# Patient Record
Sex: Female | Born: 1980 | Race: White | Hispanic: No | Marital: Married | Smoking: Never smoker
Health system: Southern US, Community
[De-identification: ages and names within clinical notes are randomized; demographics above are authoritative.]

## PROBLEM LIST (undated history)

## (undated) ENCOUNTER — Inpatient Hospital Stay (HOSPITAL_COMMUNITY): Payer: Self-pay

## (undated) DIAGNOSIS — D649 Anemia, unspecified: Secondary | ICD-10-CM

## (undated) DIAGNOSIS — H9191 Unspecified hearing loss, right ear: Secondary | ICD-10-CM

## (undated) DIAGNOSIS — G43909 Migraine, unspecified, not intractable, without status migrainosus: Secondary | ICD-10-CM

## (undated) DIAGNOSIS — T8859XA Other complications of anesthesia, initial encounter: Secondary | ICD-10-CM

## (undated) DIAGNOSIS — G44219 Episodic tension-type headache, not intractable: Secondary | ICD-10-CM

## (undated) DIAGNOSIS — T4145XA Adverse effect of unspecified anesthetic, initial encounter: Secondary | ICD-10-CM

## (undated) DIAGNOSIS — K219 Gastro-esophageal reflux disease without esophagitis: Secondary | ICD-10-CM

## (undated) HISTORY — DX: Episodic tension-type headache, not intractable: G44.219

## (undated) HISTORY — DX: Unspecified hearing loss, right ear: H91.91

## (undated) HISTORY — DX: Migraine, unspecified, not intractable, without status migrainosus: G43.909

---
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
## 2004-11-13 ENCOUNTER — Other Ambulatory Visit: Admission: RE | Admit: 2004-11-13 | Discharge: 2004-11-13 | Payer: Self-pay | Admitting: Obstetrics and Gynecology

## 2005-12-12 ENCOUNTER — Other Ambulatory Visit: Admission: RE | Admit: 2005-12-12 | Discharge: 2005-12-12 | Payer: Self-pay | Admitting: Obstetrics and Gynecology

## 2010-02-22 ENCOUNTER — Inpatient Hospital Stay (HOSPITAL_COMMUNITY): Admission: AD | Admit: 2010-02-22 | Discharge: 2010-02-22 | Payer: Self-pay | Admitting: Obstetrics and Gynecology

## 2010-10-21 ENCOUNTER — Other Ambulatory Visit: Payer: Self-pay | Admitting: Obstetrics and Gynecology

## 2010-10-21 ENCOUNTER — Inpatient Hospital Stay (HOSPITAL_COMMUNITY): Payer: 59

## 2010-10-21 ENCOUNTER — Inpatient Hospital Stay (HOSPITAL_COMMUNITY)
Admission: AD | Admit: 2010-10-21 | Discharge: 2010-10-21 | Disposition: A | Discharge status: Home / Self Care | Payer: 59 | Source: Home / Self Care | Attending: Obstetrics and Gynecology | Admitting: Obstetrics and Gynecology

## 2010-10-21 ENCOUNTER — Encounter (HOSPITAL_COMMUNITY): Payer: Self-pay

## 2010-10-21 ENCOUNTER — Inpatient Hospital Stay (HOSPITAL_COMMUNITY)
Admission: AD | Admit: 2010-10-21 | Discharge: 2010-10-24 | DRG: 766 | Disposition: A | Discharge status: Home / Self Care | Payer: 59 | Source: Ambulatory Visit | Attending: Obstetrics and Gynecology | Admitting: Obstetrics and Gynecology

## 2010-10-21 DIAGNOSIS — O99893 Other specified diseases and conditions complicating puerperium: Secondary | ICD-10-CM | POA: Diagnosis not present

## 2010-10-21 DIAGNOSIS — D649 Anemia, unspecified: Secondary | ICD-10-CM | POA: Diagnosis not present

## 2010-10-21 DIAGNOSIS — R51 Headache: Secondary | ICD-10-CM | POA: Diagnosis not present

## 2010-10-21 DIAGNOSIS — O479 False labor, unspecified: Secondary | ICD-10-CM | POA: Insufficient documentation

## 2010-10-21 DIAGNOSIS — O9903 Anemia complicating the puerperium: Secondary | ICD-10-CM | POA: Diagnosis not present

## 2010-10-21 DIAGNOSIS — O321XX0 Maternal care for breech presentation, not applicable or unspecified: Principal | ICD-10-CM | POA: Diagnosis present

## 2010-10-21 LAB — CBC
MCH: 24.9 pg — ABNORMAL LOW (ref 26.0–34.0)
MCV: 78.6 fL (ref 78.0–100.0)
Platelets: 287 10*3/uL (ref 150–400)
RBC: 4.06 MIL/uL (ref 3.87–5.11)
RDW: 15.7 % — ABNORMAL HIGH (ref 11.5–15.5)

## 2010-10-22 LAB — CBC
HCT: 22.8 % — ABNORMAL LOW (ref 36.0–46.0)
Hemoglobin: 7.3 g/dL — ABNORMAL LOW (ref 12.0–15.0)
MCH: 25.1 pg — ABNORMAL LOW (ref 26.0–34.0)
MCHC: 32 g/dL (ref 30.0–36.0)
MCV: 78.4 fL (ref 78.0–100.0)
Platelets: 219 10*3/uL (ref 150–400)
RBC: 2.91 MIL/uL — ABNORMAL LOW (ref 3.87–5.11)
RDW: 15.8 % — ABNORMAL HIGH (ref 11.5–15.5)
WBC: 14.8 10*3/uL — ABNORMAL HIGH (ref 4.0–10.5)

## 2010-10-22 LAB — RPR: RPR Ser Ql: NONREACTIVE

## 2010-10-23 LAB — CBC
MCV: 80.1 fL (ref 78.0–100.0)
Platelets: 232 10*3/uL (ref 150–400)
RBC: 2.91 MIL/uL — ABNORMAL LOW (ref 3.87–5.11)
RDW: 16.2 % — ABNORMAL HIGH (ref 11.5–15.5)
WBC: 13.5 10*3/uL — ABNORMAL HIGH (ref 4.0–10.5)

## 2010-10-25 NOTE — Op Note (Signed)
[name], [name]             ACCOUNT NO.:  [id number]  MEDICAL RECORD NO.:  [id number]           PATIENT TYPE:  I  LOCATION:  9122                          FACILITY:  [facility]  PHYSICIAN:  Crist Fat. Gaylene Moylan, M.D. DATE OF BIRTH:  December 25, 1980  DATE OF PROCEDURE: DATE OF DISCHARGE:                              OPERATIVE REPORT   PREOPERATIVE DIAGNOSIS:  Intrauterine pregnancy at 40 weeks and 5 days, breech presentation, early labor.  POSTOPERATIVE DIAGNOSIS:  Intrauterine pregnancy at 40 weeks and 5 days, breech presentation, early labor.  ANESTHESIA:  Spinal, Dana Allan, MD  PROCEDURE:  Primary low transverse cesarean section.  SURGEON:  Crist Fat. Jagar Lua, MD  ASSISTANT:  Larna Daughters, certified nurse midwife.  ESTIMATED BLOOD LOSS:  800 mL.  PROCEDURE IN DETAIL:  After being informed of the planned procedure with possible complications including bleeding, infection, injury to other organs, informed consent was obtained.  The patient was taken to OR #1 given spinal anesthesia without any complication.  She is placed in the dorsal decubitus position, pelvis tilted to the left, prepped and draped in a sterile fashion and a Foley catheter was inserted in her bladder. After assessing adequate level of anesthesia, we infiltrated the suprapubic area using 20 mL of Marcaine 0.25 and we performed a Pfannenstiel incision which was brought down sharply to the fascia.  The fascia was incised in a low transverse fashion.  Linea alba is dissected.  Peritoneum is entered bluntly.  Alexis retractor is easily positioned and visceral peritoneum is entered in a low transverse fashion allowing [place] to safely retract bladder by developing a bladder flap.  The myometrium is entered in a low transverse fashion first with knife then extended bluntly.  Amniotic fluid is thick meconium as previously known after spontaneous rupture of membranes and early labor.  We assisted the birth of a  female infant in frank breech presentation at 8:40 p.m. with an easy delivery of the head.  Mouth and nose were suctioned. Cord was clamped with two Kelly clamps and baby is given to Dr. Eric Form, neonatologist present in the room.  To be noted a 3-cm thrombus almost at the umbilical cord insertion.  This finding was shared with the neonatologist.  10 mL of blood was drawn from the umbilical vein and the placenta was allowed to deliver spontaneously.  This complete cord has three vessels and the placenta was sent to pathology.  Uterine revision was negative.  Myometrium was closed in two layers; first with a running lock suture of 0 Vicryl, then with a Lembert suture of 0 Vicryl imbricating the first one.  Hemostasis is completed on peritoneal edges using cauterization.  Both paracolic gutters were cleaned.  Both tubes and ovaries assessed and normal.  The pelvis was profusely irrigated with warm saline to confirm a satisfactory hemostasis.  Sponges and retractors were removed and under fascia hemostasis was completed with cauterization.  The fascia was closed with two running sutures of 1 Vicryl meeting midline.  The wound is irrigated with warm saline.  Hemostasis was completed with cauterization and the skin was closed with a subcuticular suture of 3-0  Monocryl and Steri-Strips.  Instrument and sponge count is complete x2.  Estimated blood loss was 800 mL.  The procedure was well tolerated by patient who is taken to recovery room in a well and stable condition.  Little boy named, Blenda Bridegroom was born at 8:40 p.m., received an Apgar of 8 at 1 minute and 9 at 5 minutes and weight 9 pounds 4 ounces.  SPECIMEN:  Placenta sent to Pathology.     Crist Fat Hayato Guaman, M.D.     SAR/MEDQ  D:  10/21/2010  T:  10/22/2010  Job:  [id number]  Electronically Signed by Silverio Lay M.D. on 10/25/2010 04:54:09 PM

## 2010-11-16 NOTE — Discharge Summary (Signed)
[name], [name]             ACCOUNT NO.:  [id number]  MEDICAL RECORD NO.:  [id number]           PATIENT TYPE:  I  LOCATION:  9122                          [facility]  [facility]  PHYSICIAN:  Hal Morales, M.D.DATE OF BIRTH:  12-27-1980  DATE OF ADMISSION:  10/21/2010 DATE OF DISCHARGE:  10/24/2010                              DISCHARGE SUMMARY   ADMITTING DIAGNOSES: 1. Intrauterine pregnancy at 40-5/7th weeks. 2. Breech presentation. 3. Early labor.  DISCHARGE DIAGNOSES: 1. Intrauterine pregnancy at 40-5/7th weeks. 2. Breech presentation. 3. Early labor. 4. Anemia. 5. Placenta with umbilical insertion thrombus.  PROCEDURES: 1. Primary low-transverse cesarean section. 2. Spinal anesthesia.  HOSPITAL COURSE:  Ms. [name] is a 30 year old gravida 1, para 0, 40- 5/7th weeks who presented on October 21, 2010 with spontaneous rupture of membranes and meconium stained fluid.  Breech presentation was identified and the patient was consented for C-section.  Her pregnancy had been remarkable for: 1. Third trimester anemia. 2. First trimester vaginal bleeding. 3. Group B strep negative.  The patient was taken to the operating room by Dr. Dois Davenport Rivard where a primary low-transverse cesarean section was performed for the breech presentation.  Findings were a viable female by the name of [name], weight 9 pounds 4 ounces.  Apgars were 8 and 9.  The infant was taken to the full-term nursery.  Mother was taken to recovery in good condition.  By postop day #1, the patient was doing well.  She was having some slight dizziness but no syncope.  Foley had just been removed.  She did began to void spontaneously and breast feeding was going well.  Her hemoglobin on day 1 was 7.3 down from 10.1, white blood cell count was 14.8, and platelet count was 219.  Her incision dressing was clean, dry, and intact.  She did have some upper abdominal distention but had bowel sounds.  Orthostatics  were done and were stable.  She was placed on iron.  She did decline transfusion.  On postop day #2, a CBC was repeated with hemoglobin stable at 7.4.  Physical exam remained within normal limits.  She was planning Micronor.  Breast feeding was going well.  Her incision dressing was removed.  It was clean, dry, and intact with Steri-Strips.  Rest of hospital course was uncomplicated.  By postop day #3, she was doing well.  She was up ad lib.  Breast feeding was going well.  She was having no syncope or dizziness.  She was deemed to receive full benefit of her hospital stay and was discharged home in stable condition.  Discharge instructions per [facility] handout.  DISCHARGE MEDICATIONS: 1. Motrin 600 mg p.o. q.6 h. p.r.n. pain. 2. Percocet 5/325 one to two p.o. q.2-4 h. p.r.n. pain. 3. Micronor 1 p.o. daily to be started 3 weeks after delivery. 4. She will also continue her Floradix iron supplement b.i.d.  FOLLOWUP:  Discharge followup will occur in 6 weeks at [facility].     Renaldo Reel Emilee Hero, C.N.M.   ______________________________ Hal Morales, M.D.    VLL/MEDQ  D:  10/24/2010  T:  10/24/2010  Job:  [id number]  Electronically Signed by Nigel Bridgeman C.N.M. on 10/29/2010 05:39:33 PM Electronically Signed by Dierdre Forth M.D. on 11/16/2010 07:10:09 PM

## 2010-11-17 LAB — URINALYSIS, ROUTINE W REFLEX MICROSCOPIC
Bilirubin Urine: NEGATIVE
Ketones, ur: >80 mg/dL — AB
Nitrite: NEGATIVE
Protein, ur: NEGATIVE mg/dL
Specific Gravity, Urine: >1.03 — ABNORMAL HIGH (ref 1.005–1.030)
Urobilinogen, UA: 0.2 mg/dL (ref 0.0–1.0)

## 2010-11-17 LAB — TYPE AND SCREEN
ABO/RH(D): A POS
Antibody Screen: NEGATIVE

## 2010-11-17 LAB — ABO/RH: ABO/RH(D): A POS

## 2010-11-17 LAB — URINE MICROSCOPIC-ADD ON

## 2011-11-24 ENCOUNTER — Other Ambulatory Visit: Payer: Self-pay | Admitting: Family Medicine

## 2011-11-24 DIAGNOSIS — R1011 Right upper quadrant pain: Secondary | ICD-10-CM

## 2011-11-26 ENCOUNTER — Ambulatory Visit
Admission: RE | Admit: 2011-11-26 | Discharge: 2011-11-26 | Disposition: A | Discharge status: Home / Self Care | Payer: BC Managed Care – PPO | Source: Ambulatory Visit | Attending: Family Medicine | Admitting: Family Medicine

## 2011-11-26 DIAGNOSIS — R1011 Right upper quadrant pain: Secondary | ICD-10-CM

## 2012-07-20 DIAGNOSIS — O26851 Spotting complicating pregnancy, first trimester: Secondary | ICD-10-CM | POA: Diagnosis not present

## 2012-07-21 ENCOUNTER — Telehealth: Payer: Self-pay | Admitting: Obstetrics and Gynecology

## 2012-07-22 NOTE — Telephone Encounter (Signed)
TC from pt. States LMP 06/16/12.  Had 2 dime-sized spots of blood 07/20/12.  Had +UPT 07/21/12.  Now having brownish discharge and rt-sided abd cramping.  No recent IC. Cannot come to office at this time.   Per [facility], advised increased water. May take Tylenol. To call after hours (instructions given) to be seen at MAU this PM. Will be able to let pt know at that time, waiting time in MAU.  Pt is agreeable. Trenton Gammon comprehension.

## 2012-07-23 ENCOUNTER — Encounter (HOSPITAL_COMMUNITY): Payer: Self-pay | Admitting: *Deleted

## 2012-07-23 ENCOUNTER — Other Ambulatory Visit: Payer: Self-pay

## 2012-07-23 ENCOUNTER — Inpatient Hospital Stay (HOSPITAL_COMMUNITY)
Admission: AD | Admit: 2012-07-23 | Discharge: 2012-07-23 | Disposition: A | Discharge status: Home / Self Care | Payer: 59 | Source: Ambulatory Visit | Attending: Obstetrics and Gynecology | Admitting: Obstetrics and Gynecology

## 2012-07-23 ENCOUNTER — Telehealth: Payer: Self-pay | Admitting: Obstetrics and Gynecology

## 2012-07-23 DIAGNOSIS — O209 Hemorrhage in early pregnancy, unspecified: Secondary | ICD-10-CM

## 2012-07-23 DIAGNOSIS — O26851 Spotting complicating pregnancy, first trimester: Secondary | ICD-10-CM | POA: Diagnosis not present

## 2012-07-23 DIAGNOSIS — O200 Threatened abortion: Secondary | ICD-10-CM

## 2012-07-23 DIAGNOSIS — O26859 Spotting complicating pregnancy, unspecified trimester: Secondary | ICD-10-CM | POA: Insufficient documentation

## 2012-07-23 DIAGNOSIS — R109 Unspecified abdominal pain: Secondary | ICD-10-CM | POA: Insufficient documentation

## 2012-07-23 HISTORY — DX: Migraine, unspecified, not intractable, without status migrainosus: G43.909

## 2012-07-23 HISTORY — DX: Anemia, unspecified: D64.9

## 2012-07-23 LAB — WET PREP, GENITAL
Trich, Wet Prep: NONE SEEN
Yeast Wet Prep HPF POC: NONE SEEN

## 2012-07-23 LAB — URINALYSIS, ROUTINE W REFLEX MICROSCOPIC
Glucose, UA: NEGATIVE mg/dL
Leukocytes, UA: NEGATIVE
Protein, ur: NEGATIVE mg/dL
Specific Gravity, Urine: 1.01 (ref 1.005–1.030)

## 2012-07-23 LAB — URINE MICROSCOPIC-ADD ON

## 2012-07-23 NOTE — MAU Note (Signed)
Patient states she has had two positive home pregnancy tests. Had spotting on 11-19, two small clots. Has some mild right side discomfort.

## 2012-07-23 NOTE — MAU Provider Note (Signed)
History   [name] is a 31y.o. MWF who presents at [redacted]w[redacted]d per LMP for eval of recent spotting.  Reports spotting 2d ago and describing spots about the size of a dime, and none since.  Has had some mild lower abdominal cramping.  No n/v/d.  No resp c/o's.  No UTI s/s.  No recent fever or illness.  Planned pregnancy.  H/o previous c/s for breech.  Struggled w/ first trimester n/v last pregnancy, but none this pregnancy thus far.    CSN: [id number]  Arrival date and time: 07/23/12 1455   None     Chief Complaint  Patient presents with  . Abdominal Pain   HPI  OB History    Grav Para Term Preterm Abortions TAB SAB Ect Mult Living   2 1 1       1       Past Medical History  Diagnosis Date  . Anemia   . Migraines     Past Surgical History  Procedure Date  . Cesarean section     Family History  Problem Relation Age of Onset  . Hypertension Mother   . Diabetes Father   . Cancer Father   . Hypertension Father     History  Substance Use Topics  . Smoking status: Never Smoker   . Smokeless tobacco: Not on file  . Alcohol Use: No    Allergies:  Allergies  Allergen Reactions  . Clams (Shellfish Allergy) Hives    Prescriptions prior to admission  Medication Sig Dispense Refill  . cetirizine (ZYRTEC) 10 MG tablet Take 10 mg by mouth daily.      . [DISCONTINUED] ibuprofen (ADVIL,MOTRIN) 200 MG tablet Take 200 mg by mouth every 6 (six) hours as needed. For pain        ROS--see HPI above Physical Exam   Blood pressure 124/70, pulse 100, temperature 99.1 F (37.3 C), temperature source Oral, resp. rate 16, height 5\' 6"  (1.676 m), weight 150 lb 3.2 oz (68.13 kg), last menstrual period 06/16/2012, SpO2 100.00%, unknown if currently breastfeeding. .. .. Results for orders placed during the hospital encounter of 07/23/12 (from the past 24 hour(s))  URINALYSIS, ROUTINE W REFLEX MICROSCOPIC     Status: Abnormal   Collection Time   07/23/12  3:30 PM      Component  Value Range   Color, Urine YELLOW  YELLOW   APPearance CLEAR  CLEAR   Specific Gravity, Urine 1.010  1.005 - 1.030   pH 6.0  5.0 - 8.0   Glucose, UA NEGATIVE  NEGATIVE mg/dL   Hgb urine dipstick SMALL (*) NEGATIVE   Bilirubin Urine NEGATIVE  NEGATIVE   Ketones, ur NEGATIVE  NEGATIVE mg/dL   Protein, ur NEGATIVE  NEGATIVE mg/dL   Urobilinogen, UA 0.2  0.0 - 1.0 mg/dL   Nitrite NEGATIVE  NEGATIVE   Leukocytes, UA NEGATIVE  NEGATIVE  URINE MICROSCOPIC-ADD ON     Status: Abnormal   Collection Time   07/23/12  3:30 PM      Component Value Range   Squamous Epithelial / LPF FEW (*) RARE   WBC, UA 0-2  <3 WBC/hpf   RBC / HPF 3-6  <3 RBC/hpf  HCG, QUANTITATIVE, PREGNANCY     Status: Abnormal   Collection Time   07/23/12  3:33 PM      Component Value Range   hCG, Beta Chain, Quant, S 895 (*) <5 mIU/mL  POCT PREGNANCY, URINE     Status: Abnormal   Collection  Time   07/23/12  3:34 PM      Component Value Range   Preg Test, Ur POSITIVE (*) NEGATIVE  WET PREP, GENITAL     Status: Abnormal   Collection Time   07/23/12  5:12 PM      Component Value Range   Yeast Wet Prep HPF POC NONE SEEN  NONE SEEN   Trich, Wet Prep NONE SEEN  NONE SEEN   Clue Cells Wet Prep HPF POC NONE SEEN  NONE SEEN   WBC, Wet Prep HPF POC FEW (*) NONE SEEN   Physical Exam  Constitutional: She is oriented to person, place, and time. She appears well-developed and well-nourished. No distress.       Smiling, pleasant  HENT:  Head: Normocephalic and atraumatic.  Eyes: Pupils are equal, round, and reactive to light.  Cardiovascular: Normal rate.   Respiratory: Effort normal.  GI: Soft. She exhibits no distension and no mass. There is no tenderness. There is no rebound and no guarding.  Genitourinary:       SSE:  No blood in vault; small amt white nonodorous d/c in vault, no lesions.  cx closed.  Deferred bimanual exam.  Neurological: She is alert and oriented to person, place, and time.  Skin: Skin is warm and  dry.  Psychiatric: She has a normal mood and affect. Her behavior is normal. Judgment and thought content normal.    MAU Course  Procedures 1. Wet prep 2. Gc/ct 3. Quant hcg  Assessment and Plan  1.  [redacted]w[redacted]d 2.  Spotting 2d ago 3. Rh positive 4.  Mild intermittent lower abdominal cramping  1.  D/c'd home to schedule f/u quant next week at office to follow values and will proceed w/ viability u/s prn 2.  Bleeding and SAB precautions rev'd 3.  Pt to call if onset of morning sickness and needs Rx; continue PNV; Tylenol prn pain and f/u worsening s/s or concerns  Hadden Steig H 07/23/2012, 5:39 PM

## 2012-07-23 NOTE — Telephone Encounter (Signed)
Returned pt's VM. States was unable to be seen at MAU last PM. No appts available in office today.  Per [facility], pt to call  Her directly after pt completes meeting at 2:00. Pt verbalizes comprehension. Pt states no change in sx.

## 2012-07-24 DIAGNOSIS — Z98891 History of uterine scar from previous surgery: Secondary | ICD-10-CM | POA: Insufficient documentation

## 2012-07-26 ENCOUNTER — Telehealth: Payer: Self-pay | Admitting: Obstetrics and Gynecology

## 2012-07-26 ENCOUNTER — Other Ambulatory Visit: Payer: 59

## 2012-07-26 DIAGNOSIS — O039 Complete or unspecified spontaneous abortion without complication: Secondary | ICD-10-CM

## 2012-07-26 NOTE — Telephone Encounter (Signed)
VM from [facility].  Pt os coming this AM for [facility].  May be routine, not STAT. Qhcg 07/23/12 895.

## 2012-07-27 ENCOUNTER — Other Ambulatory Visit: Payer: Self-pay | Admitting: Obstetrics and Gynecology

## 2012-07-27 ENCOUNTER — Telehealth: Payer: Self-pay

## 2012-07-27 DIAGNOSIS — O26849 Uterine size-date discrepancy, unspecified trimester: Secondary | ICD-10-CM

## 2012-07-27 LAB — GC/CHLAMYDIA PROBE AMP, GENITAL: Chlamydia, DNA Probe: NEGATIVE

## 2012-07-27 LAB — HCG, QUANTITATIVE, PREGNANCY: hCG, Beta Chain, Quant, S: 2958.5 m[IU]/mL

## 2012-07-27 NOTE — Telephone Encounter (Signed)
Tc to pt.  Per VL, informed QHCG rose appropriately.   Scheduled for U/S and F/u 07/28/12

## 2012-07-28 ENCOUNTER — Ambulatory Visit (INDEPENDENT_AMBULATORY_CARE_PROVIDER_SITE_OTHER): Payer: 59

## 2012-07-28 ENCOUNTER — Ambulatory Visit (INDEPENDENT_AMBULATORY_CARE_PROVIDER_SITE_OTHER): Payer: 59 | Admitting: Obstetrics and Gynecology

## 2012-07-28 DIAGNOSIS — O26849 Uterine size-date discrepancy, unspecified trimester: Secondary | ICD-10-CM

## 2012-07-28 DIAGNOSIS — R110 Nausea: Secondary | ICD-10-CM

## 2012-07-28 LAB — US OB TRANSVAGINAL

## 2012-07-28 MED ORDER — ONDANSETRON HCL 4 MG PO TABS: 4.0000 mg | ORAL_TABLET | Freq: Three times a day (TID) | ORAL | Status: DC | PRN

## 2012-07-28 MED ORDER — PANTOPRAZOLE SODIUM 40 MG PO TBEC: 40.0000 mg | DELAYED_RELEASE_TABLET | Freq: Every day | ORAL | Status: DC

## 2012-07-28 NOTE — Progress Notes (Signed)
Here for [place] f/u--had QHCGs done showing rise from 895 to 2958 over 3 day period. Had been seen in MAU on 11/25 by HS for spotting. No further spotting, no pain. A+ type. Had baby with [facility] 21 months ago--denies any changes in medical history or family history.  Same partner.  [place] today:  5 4/7 weeks, EDC 03/26/13, single intrauterine gestational sac, + YS. Right CL cyst, small amount fluid in CDS.  Plan: Schedule NOB w/u with VL or HS in 3 weeks, with [place] that day for verification of viability. Patient agreeable with plan. Will ask triage to transfer information from previous chart to EPIC. Requests Rxs for Protonix and Zofran--sent to pharmacy.

## 2012-08-03 DIAGNOSIS — O26851 Spotting complicating pregnancy, first trimester: Secondary | ICD-10-CM

## 2012-08-13 ENCOUNTER — Telehealth: Payer: Self-pay | Admitting: Obstetrics and Gynecology

## 2012-08-13 DIAGNOSIS — R1110 Vomiting, unspecified: Secondary | ICD-10-CM

## 2012-08-13 MED ORDER — PROMETHAZINE HCL 25 MG PO TABS: 25.0000 mg | ORAL_TABLET | Freq: Four times a day (QID) | ORAL | Status: DC | PRN

## 2012-08-13 NOTE — Telephone Encounter (Signed)
TC from patient--8 weeks, with N/V.  On Zofran, but not helping tonight. Requests Phenergan--wants to try that before determining if needs to come to hospital. Will send Rx for Phenergan 25 mg po Q 6 hours prn to [facility] [address] Patient to call if no relief of sx.

## 2012-08-18 ENCOUNTER — Other Ambulatory Visit: Payer: 59

## 2012-08-19 ENCOUNTER — Encounter: Payer: Self-pay | Admitting: Obstetrics and Gynecology

## 2012-08-19 ENCOUNTER — Ambulatory Visit (INDEPENDENT_AMBULATORY_CARE_PROVIDER_SITE_OTHER): Payer: 59 | Admitting: Obstetrics and Gynecology

## 2012-08-19 ENCOUNTER — Ambulatory Visit (INDEPENDENT_AMBULATORY_CARE_PROVIDER_SITE_OTHER): Payer: 59

## 2012-08-19 ENCOUNTER — Other Ambulatory Visit: Payer: Self-pay | Admitting: Obstetrics and Gynecology

## 2012-08-19 ENCOUNTER — Other Ambulatory Visit: Payer: Self-pay

## 2012-08-19 VITALS — BP 100/62 | Wt 148.0 lb

## 2012-08-19 DIAGNOSIS — Z9889 Other specified postprocedural states: Secondary | ICD-10-CM

## 2012-08-19 DIAGNOSIS — O26849 Uterine size-date discrepancy, unspecified trimester: Secondary | ICD-10-CM

## 2012-08-19 DIAGNOSIS — Z3490 Encounter for supervision of normal pregnancy, unspecified, unspecified trimester: Secondary | ICD-10-CM

## 2012-08-19 DIAGNOSIS — Z331 Pregnant state, incidental: Secondary | ICD-10-CM

## 2012-08-19 DIAGNOSIS — Z98891 History of uterine scar from previous surgery: Secondary | ICD-10-CM

## 2012-08-19 DIAGNOSIS — IMO0002 Reserved for concepts with insufficient information to code with codable children: Secondary | ICD-10-CM

## 2012-08-19 LAB — US OB TRANSVAGINAL

## 2012-08-19 NOTE — Progress Notes (Signed)
   [name] is being seen today for her first obstetrical visit at [redacted]w[redacted]d gestation by early Korea.  Seen at 5-6 weeks for spotting, with [place] and cultures done previously. No further spotting.  She reports doing well.  Her obstetrical history is significant for: Patient Active Problem List  Diagnosis  . Spotting in first trimester  . Previous cesarean section    Relationship with FOB: Married to [place], involved and supportive She is employed full0time with [employer].   Feeding plan: Breast    Pregnancy history fully reviewed.  The following portions of the patient's history were reviewed and updated as appropriate: allergies, current medications, past family history, past medical history, past social history, past surgical history and problem list.  Review of Systems Pertinent ROS is described in HPI   Objective:   BP 100/62  Wt 148 lb (67.132 kg)  LMP 06/16/2012  Breastfeeding? Unknown Wt Readings from Last 1 Encounters:  08/19/12 148 lb (67.132 kg)   BMI: There is no height on file to calculate BMI.  General: alert, cooperative and no distress HEENT: grossly normal  Thyroid: normal  Respiratory: clear to auscultation bilaterally Cardiovascular: regular rate and rhythm,  Breasts:  No dominant masses, nipples erect Gastrointestinal: soft, non-tender; no masses,  no organomegaly Extremities: extremities normal, no pain or edema Vaginal Bleeding: None  EXTERNAL GENITALIA: normal appearing vulva with no masses, tenderness or lesions VAGINA: no abnormal discharge or lesions CERVIX: no lesions or cervical motion tenderness; cervix closed, long, firm UTERUS: gravid and consistent with 9 weeks ADNEXA: no masses palpable and nontender OB EXAM PELVIMETRY: unproven--had previous C/S due to FTP, LGA infant  [place] today for viability:  8w 3d, EDC 03/28/13--best dating since embryo seen on this [place].  FHR:  160  bpm  Assessment:    Pregnancy at [redacted]w[redacted]d 1st trimester  spotting Previous C/S, hx LGA infant Plan:     Prenatal panel reviewed and discussed with the patient:  Done today  Pap smear collected:  Done 1/13 GC/Chlamydia collected:  Done 07/23/12 Wet prep:  Negative 07/23/12 Discussion of Genetic testing options: Declines Prenatal vitamins recommended  Plan of care: Next visit:  4 weeks for ROB Other anticipated f/u: Early glucola due to hx LGA infant Discussed VBAC issue--patient undecided regarding VBAC vs. Repeat C/S      Nigel Bridgeman, CNM

## 2012-08-19 NOTE — Progress Notes (Signed)
[redacted]w[redacted]d  Last Pap: 09/2011 Pt declined Genetic Testing. Pt states she has no concerns today. She sometimes has discomfort in her groin area.  Pt asked about blood work. I do not see prenatal labs .

## 2012-08-19 NOTE — Progress Notes (Signed)
[redacted]w[redacted]d

## 2012-08-20 LAB — PRENATAL PANEL VII
Antibody Screen: NEGATIVE
Basophils Absolute: 0.1 K/uL (ref 0.0–0.1)
Basophils Relative: 1 % (ref 0–1)
Eosinophils Absolute: 0.1 K/uL (ref 0.0–0.7)
Eosinophils Relative: 1 % (ref 0–5)
HCT: 36 % (ref 36.0–46.0)
HIV: NONREACTIVE
Hemoglobin: 12.5 g/dL (ref 12.0–15.0)
Hepatitis B Surface Ag: NEGATIVE
Lymphocytes Relative: 32 % (ref 12–46)
Lymphs Abs: 3.5 K/uL (ref 0.7–4.0)
MCH: 29.1 pg (ref 26.0–34.0)
MCHC: 34.7 g/dL (ref 30.0–36.0)
MCV: 83.9 fL (ref 78.0–100.0)
Monocytes Absolute: 0.7 K/uL (ref 0.1–1.0)
Monocytes Relative: 6 % (ref 3–12)
Neutro Abs: 6.5 K/uL (ref 1.7–7.7)
Neutrophils Relative %: 60 % (ref 43–77)
Platelets: 335 K/uL (ref 150–400)
RBC: 4.29 MIL/uL (ref 3.87–5.11)
RDW: 14.5 % (ref 11.5–15.5)
Rh Type: POSITIVE
Rubella: 3.03 {index} — ABNORMAL HIGH
WBC: 10.9 K/uL — ABNORMAL HIGH (ref 4.0–10.5)

## 2012-08-20 LAB — PATHOLOGIST SMEAR REVIEW

## 2012-08-21 ENCOUNTER — Encounter: Payer: Self-pay | Admitting: Obstetrics and Gynecology

## 2012-08-21 DIAGNOSIS — IMO0002 Reserved for concepts with insufficient information to code with codable children: Secondary | ICD-10-CM | POA: Insufficient documentation

## 2012-08-21 NOTE — Progress Notes (Signed)
May want C/S if another LGA fetus--plan Korea at 28-30 weeks and at 37-38 weeks.

## 2012-08-31 ENCOUNTER — Telehealth: Payer: Self-pay | Admitting: Obstetrics and Gynecology

## 2012-08-31 NOTE — Telephone Encounter (Signed)
Pt just wanted [place] to know that she changed her mind and now want genetic testing. Per mel will put pt request on her next visit.

## 2012-09-17 ENCOUNTER — Ambulatory Visit: Payer: 59 | Admitting: Obstetrics and Gynecology

## 2012-09-17 ENCOUNTER — Encounter: Payer: Self-pay | Admitting: Obstetrics and Gynecology

## 2012-09-17 VITALS — BP 98/58 | Wt 150.0 lb

## 2012-09-17 DIAGNOSIS — Z36 Encounter for antenatal screening of mother: Secondary | ICD-10-CM

## 2012-09-17 NOTE — Progress Notes (Signed)
[redacted]w[redacted]d Pt changed her mind and now wants 1st tri screening. Scheduled 09/20/12 @ 11 am No complaints per pt

## 2012-09-17 NOTE — Progress Notes (Signed)
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

## 2012-09-20 ENCOUNTER — Ambulatory Visit: Payer: 59

## 2012-09-20 ENCOUNTER — Other Ambulatory Visit: Payer: 59

## 2012-09-20 DIAGNOSIS — Z36 Encounter for antenatal screening of mother: Secondary | ICD-10-CM

## 2012-09-20 LAB — US OB COMP LESS 14 WKS

## 2012-10-14 ENCOUNTER — Encounter: Payer: 59 | Admitting: Obstetrics and Gynecology

## 2012-10-22 ENCOUNTER — Ambulatory Visit: Payer: 59 | Admitting: Family Medicine

## 2012-10-22 VITALS — BP 90/60 | Wt 153.0 lb

## 2012-10-22 DIAGNOSIS — Z331 Pregnant state, incidental: Secondary | ICD-10-CM

## 2012-10-22 NOTE — Progress Notes (Signed)
[redacted]w[redacted]d Doing well. HA subsided with magnesium. 1st trimester screen result given with copy. Q&A answered.  ROB 3 weeks:Discussed early Glucola (for Hx LGA) and anatomy U/S at next visit. Declined AFP.   L.Blair Mesina, FNP-BC

## 2012-10-22 NOTE — Progress Notes (Signed)
[redacted]w[redacted]d No complaints today.

## 2012-11-03 ENCOUNTER — Other Ambulatory Visit: Payer: Self-pay

## 2012-11-03 DIAGNOSIS — Z3689 Encounter for other specified antenatal screening: Secondary | ICD-10-CM

## 2012-11-08 ENCOUNTER — Ambulatory Visit: Payer: 59 | Admitting: Family Medicine

## 2012-11-08 ENCOUNTER — Other Ambulatory Visit: Payer: 59

## 2012-11-08 ENCOUNTER — Ambulatory Visit: Payer: 59

## 2012-11-08 VITALS — BP 90/60 | Wt 156.0 lb

## 2012-11-08 DIAGNOSIS — Z3689 Encounter for other specified antenatal screening: Secondary | ICD-10-CM

## 2012-11-08 DIAGNOSIS — Z331 Pregnant state, incidental: Secondary | ICD-10-CM

## 2012-11-08 LAB — US OB COMP + 14 WK

## 2012-11-08 LAB — CBC
HCT: 30.6 % — ABNORMAL LOW (ref 36.0–46.0)
Hemoglobin: 10.7 g/dL — ABNORMAL LOW (ref 12.0–15.0)
MCH: 29.8 pg (ref 26.0–34.0)
MCHC: 35 g/dL (ref 30.0–36.0)
MCV: 85.2 fL (ref 78.0–100.0)
Platelets: 303 10*3/uL (ref 150–400)
RBC: 3.59 MIL/uL — ABNORMAL LOW (ref 3.87–5.11)
RDW: 13.8 % (ref 11.5–15.5)
WBC: 9.2 10*3/uL (ref 4.0–10.5)

## 2012-11-08 NOTE — Progress Notes (Signed)
[redacted]w[redacted]d No complaints today. Ultrasound shows:  SIUP  S=D     [place] EDD: 03/28/2013           AFI: Normal fluid, AP pockt=4.3cm           Cervical length: 3.43 cm/Closed                                            Placenta localization: posterior           Fetal presentation: Vertex--Single fetus                   Anatomy survey is normal           Gender : female                                 Note:Normal placental cord insertion                                 No previa, placenta edge to cx=5.7cm                                 Measurements are concordant with established GA/EDD                                 Open hands, 5th digit seen.                                 Normal ovaries/adnexa

## 2012-11-08 NOTE — Progress Notes (Signed)
[redacted]w[redacted]d Doing well. Now having fetal movement. U/S discussed.  No questions. Early glucola today. ROB in 4 weeks.

## 2012-11-09 ENCOUNTER — Telehealth: Payer: Self-pay

## 2012-11-09 LAB — RPR

## 2012-11-09 LAB — GLUCOSE TOLERANCE, 1 HOUR (50G) W/O FASTING: Glucose, 1 Hour GTT: 91 mg/dL (ref 70–140)

## 2012-11-09 NOTE — Telephone Encounter (Signed)
Tried to reach pt regarding iron suppl. Voicemail was not working properly. Will try to reach pt again on tomorrow. [name]

## 2012-11-11 ENCOUNTER — Telehealth: Payer: Self-pay

## 2012-11-11 NOTE — Telephone Encounter (Signed)
Pt's phone not working. Letter will be sent to pt. [name]

## 2012-11-16 ENCOUNTER — Telehealth: Payer: Self-pay | Admitting: Certified Nurse Midwife

## 2012-11-16 NOTE — Telephone Encounter (Signed)
TC from pt regarding significant BH for the last 3-4 days.  Pt reports they are irregular and has not tried to time them.  Advised the pt to empty bladder, hydrate, rest, and Motrin.  Advised pt to call back if sx persist or gets worse.  Pt verbalizes understanding.

## 2012-11-24 ENCOUNTER — Other Ambulatory Visit: Payer: Self-pay | Admitting: Family Medicine

## 2012-11-24 LAB — FETAL FIBRONECTIN: Fetal Fibronectin: NEGATIVE

## 2012-11-25 LAB — GC/CHLAMYDIA PROBE AMP
CT Probe RNA: NEGATIVE
GC Probe RNA: NEGATIVE

## 2012-11-27 LAB — CULTURE, OB URINE: Colony Count: 15000

## 2013-03-15 ENCOUNTER — Other Ambulatory Visit: Payer: Self-pay | Admitting: Obstetrics and Gynecology

## 2013-03-16 ENCOUNTER — Encounter (HOSPITAL_COMMUNITY): Payer: Self-pay | Admitting: Pharmacist

## 2013-03-21 DIAGNOSIS — G43909 Migraine, unspecified, not intractable, without status migrainosus: Secondary | ICD-10-CM | POA: Diagnosis not present

## 2013-03-21 DIAGNOSIS — H9191 Unspecified hearing loss, right ear: Secondary | ICD-10-CM

## 2013-03-21 NOTE — H&P (Signed)
[name] is a 32 y.o. female, G2P1001 at 106 1/7/weeks, presenting on 03/23/13 for scheduled repeat cesarean delivery--hx of previous C/S due to breech, with LGA infant.  Patient denies leaking or bleeding, reports + FM.  This fetus has been vtx on last [place] at 36 weeks, with EFW > 90%ile.  History of present pregnancy: Patient entered care at 9 1/7 weeks.  Had early [place] at 5-6 weeks and cultures done due to 1st trimester bleeding. EDC of 03/28/13 was established by [place] at 8 3/7 weeks.   Anatomy scan:   20 weeks, with normal findings and an posterior placenta.   Additional [place] evaluations:   28 weeks, due to hx of LGA infant--EFW 65.9ile%, AFI 55ile%, normal cervical length. 36 weeks--EFW 7+13, > 90%ile, AFI 60%ile, vtx.   Significant prenatal events:  1st trimester bleeding.  FFN negative on 11/24/12.  Initially undecided regarding VBAC vs repeat C/S--eventually decided on repeat C/S if no labor by date the C/S was scheduled.  Had HAs during pregnancy, treated to magnesium.  Hgb 9.6 at 28 weeks--Fe supplementation advised.  Treated for hemorrhoids with Proctofoam HC.  Received TDaP during pregnancy. Last evaluation:  7/14, cervix 1 cm, 80%, vtx, -1.  History OB History   Grav Para Term Preterm Abortions TAB SAB Ect Mult Living   2 1 1       1     10/2010--Primary LTCS, 40 weeks, breech presentation, 9+4, spinal anesthesia, by Dr. Estanislado Pandy.  Past Medical History  Diagnosis Date  . Anemia   . Migraines   . Asthma   . Frequent episodic tension-type headache   . Migraine   . 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 Cesarean section    . 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  Social History:  reports that she has never smoked. She has never used smokeless tobacco. She reports that she does not drink alcohol or use illicit drugs. Husband, Molli Hazard, is involved and supportive.  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   Prenatal Transfer Tool  Maternal Diabetes: No Genetic Screening: Normal 1st trimester screen. Maternal Ultrasounds/Referrals: Normal Fetal Ultrasounds or other Referrals:  None Maternal Substance Abuse:  No Significant Maternal Medications:  None Significant Maternal Lab Results:  Lab values include: Group B Strep negative Other Comments:  None  ROS:  +FM.    Last menstrual period 06/16/2012. Exam Physical Exam  Chest clear Heart RRR without murmur Abd gravid, NT Pelvic--deferred Ext WNL  FHR 150s by doppler UCs none per patient  Prenatal labs: ABO, Rh: A/POS/-- (12/19 1233) Antibody: NEG (12/19 1233) Rubella: 3.03 (12/19 1233) RPR: NON REAC (03/10 0957)  HBsAg: NEGATIVE (12/19 1233)  HIV: NON REACTIVE (12/19 1233)  GBS:   Negative Pap WNL 09/2011 Cultures negative 07/2012 FFN negative 3/36/14 Glucola WNL x 2 during pregnancy Hgb 12.5 at NOB, 9.6 at 28 weeks 1st trimester screen WNL  Assessment: IUP at 40 1/7 weeks Previous C/S, desires repeat GBS negative   Plan: Admit to [facility] per consult with Dr. Estanislado Pandy Routine [facility] pre-op orders  Juan Kissoon 03/21/2013, 6:04 AM

## 2013-03-22 ENCOUNTER — Encounter (HOSPITAL_COMMUNITY)
Admission: RE | Admit: 2013-03-22 | Discharge: 2013-03-22 | Disposition: A | Discharge status: Home / Self Care | Payer: BC Managed Care – PPO | Source: Ambulatory Visit | Attending: Obstetrics and Gynecology | Admitting: Obstetrics and Gynecology

## 2013-03-22 ENCOUNTER — Encounter (HOSPITAL_COMMUNITY): Payer: Self-pay

## 2013-03-22 ENCOUNTER — Inpatient Hospital Stay (HOSPITAL_COMMUNITY)
Admission: RE | Admit: 2013-03-22 | Discharge: 2013-03-25 | DRG: 370 | Disposition: A | Discharge status: Home / Self Care | Payer: BC Managed Care – PPO | Source: Ambulatory Visit | Attending: Obstetrics and Gynecology | Admitting: Obstetrics and Gynecology

## 2013-03-22 VITALS — BP 110/80 | Ht 66.0 in | Wt 182.0 lb

## 2013-03-22 DIAGNOSIS — D649 Anemia, unspecified: Secondary | ICD-10-CM | POA: Diagnosis not present

## 2013-03-22 DIAGNOSIS — IMO0002 Reserved for concepts with insufficient information to code with codable children: Secondary | ICD-10-CM

## 2013-03-22 DIAGNOSIS — O34219 Maternal care for unspecified type scar from previous cesarean delivery: Principal | ICD-10-CM | POA: Diagnosis present

## 2013-03-22 DIAGNOSIS — Z98891 History of uterine scar from previous surgery: Secondary | ICD-10-CM

## 2013-03-22 DIAGNOSIS — O26851 Spotting complicating pregnancy, first trimester: Secondary | ICD-10-CM

## 2013-03-22 DIAGNOSIS — O9903 Anemia complicating the puerperium: Secondary | ICD-10-CM | POA: Diagnosis not present

## 2013-03-22 DIAGNOSIS — H9191 Unspecified hearing loss, right ear: Secondary | ICD-10-CM

## 2013-03-22 DIAGNOSIS — G43909 Migraine, unspecified, not intractable, without status migrainosus: Secondary | ICD-10-CM | POA: Diagnosis not present

## 2013-03-22 HISTORY — DX: Other complications of anesthesia, initial encounter: T88.59XA

## 2013-03-22 HISTORY — DX: Adverse effect of unspecified anesthetic, initial encounter: T41.45XA

## 2013-03-22 HISTORY — DX: Gastro-esophageal reflux disease without esophagitis: K21.9

## 2013-03-22 LAB — CBC
HCT: 31.1 % — ABNORMAL LOW (ref 36.0–46.0)
Hemoglobin: 10.2 g/dL — ABNORMAL LOW (ref 12.0–15.0)
MCH: 26.2 pg (ref 26.0–34.0)
MCHC: 32.8 g/dL (ref 30.0–36.0)
MCV: 79.9 fL (ref 78.0–100.0)
Platelets: 257 10*3/uL (ref 150–400)
RBC: 3.89 MIL/uL (ref 3.87–5.11)
RDW: 15.7 % — ABNORMAL HIGH (ref 11.5–15.5)
WBC: 7.2 10*3/uL (ref 4.0–10.5)

## 2013-03-22 LAB — TYPE AND SCREEN: Antibody Screen: NEGATIVE

## 2013-03-22 LAB — RPR: RPR Ser Ql: NONREACTIVE

## 2013-03-22 NOTE — Patient Instructions (Signed)
Your procedure is scheduled on:03/23/13  Enter through the Main Entrance at :0930 Pick up desk phone and dial [phone] and inform us of your arrival.  Please call [phone] if you have any problems the morning of surgery.  Remember: Do not eat food or drink liquids after midnight:tonight- water ok until 7am   You may brush your teeth the morning of surgery.  Take these meds the morning of surgery with a sip of water:Pantoprazole  DO NOT wear jewelry, eye make-up, lipstick,body lotion, or dark fingernail polish.  (Polished toes are ok) You may wear deodorant.  If you are to be admitted after surgery, leave suitcase in car until your room has been assigned. Patients discharged on the day of surgery will not be allowed to drive home. Wear loose fitting, comfortable clothes for your ride home.

## 2013-03-23 ENCOUNTER — Encounter (HOSPITAL_COMMUNITY): Payer: Self-pay | Admitting: Anesthesiology

## 2013-03-23 ENCOUNTER — Encounter (HOSPITAL_COMMUNITY)
Admission: RE | Disposition: A | Discharge status: Home / Self Care | Payer: Self-pay | Source: Ambulatory Visit | Attending: Obstetrics and Gynecology

## 2013-03-23 ENCOUNTER — Inpatient Hospital Stay (HOSPITAL_COMMUNITY): Payer: BC Managed Care – PPO | Admitting: Anesthesiology

## 2013-03-23 SURGERY — Surgical Case
Anesthesia: Spinal | Wound class: Clean Contaminated

## 2013-03-23 MED ORDER — SIMETHICONE 80 MG PO CHEW
80.0000 mg | CHEWABLE_TABLET | Freq: Three times a day (TID) | ORAL | Status: DC
  Administered 2013-03-23 – 2013-03-25 (×6): 80 mg via ORAL

## 2013-03-23 MED ORDER — GLYCOPYRROLATE 0.2 MG/ML IJ SOLN
INTRAMUSCULAR | Status: DC | PRN
  Administered 2013-03-23: 0.2 mg via INTRAVENOUS

## 2013-03-23 MED ORDER — HYDROMORPHONE HCL PF 1 MG/ML IJ SOLN: 1.0000 mg | Freq: Once | INTRAMUSCULAR | Status: DC

## 2013-03-23 MED ORDER — ONDANSETRON HCL 4 MG/2ML IJ SOLN
INTRAMUSCULAR | Status: DC | PRN
  Administered 2013-03-23: 4 mg via INTRAVENOUS

## 2013-03-23 MED ORDER — BUPIVACAINE HCL (PF) 0.25 % IJ SOLN
INTRAMUSCULAR | Status: AC
  Filled 2013-03-23: qty 30

## 2013-03-23 MED ORDER — SODIUM CHLORIDE 0.9 % IJ SOLN: 3.0000 mL | INTRAMUSCULAR | Status: DC | PRN

## 2013-03-23 MED ORDER — ONDANSETRON HCL 4 MG/2ML IJ SOLN
INTRAMUSCULAR | Status: AC
  Filled 2013-03-23: qty 2

## 2013-03-23 MED ORDER — GLYCOPYRROLATE 0.2 MG/ML IJ SOLN
INTRAMUSCULAR | Status: AC
  Filled 2013-03-23: qty 2

## 2013-03-23 MED ORDER — MENTHOL 3 MG MT LOZG: 1.0000 | LOZENGE | OROMUCOSAL | Status: DC | PRN

## 2013-03-23 MED ORDER — MORPHINE SULFATE 0.5 MG/ML IJ SOLN
INTRAMUSCULAR | Status: AC
  Filled 2013-03-23: qty 10

## 2013-03-23 MED ORDER — OXYTOCIN 10 UNIT/ML IJ SOLN
40.0000 [IU] | INTRAVENOUS | Status: DC | PRN
  Administered 2013-03-23: 40 [IU] via INTRAVENOUS

## 2013-03-23 MED ORDER — MORPHINE SULFATE (PF) 0.5 MG/ML IJ SOLN
INTRAMUSCULAR | Status: DC | PRN
  Administered 2013-03-23: .2 mg via INTRATHECAL

## 2013-03-23 MED ORDER — ONDANSETRON HCL 4 MG/2ML IJ SOLN: 4.0000 mg | INTRAMUSCULAR | Status: DC | PRN

## 2013-03-23 MED ORDER — PRENATAL MULTIVITAMIN CH
1.0000 | ORAL_TABLET | Freq: Every day | ORAL | Status: DC
  Administered 2013-03-24: 1 via ORAL
  Filled 2013-03-23: qty 1

## 2013-03-23 MED ORDER — IBUPROFEN 600 MG PO TABS
600.0000 mg | ORAL_TABLET | Freq: Four times a day (QID) | ORAL | Status: DC
  Administered 2013-03-24 – 2013-03-25 (×6): 600 mg via ORAL
  Filled 2013-03-23 (×6): qty 1

## 2013-03-23 MED ORDER — SCOPOLAMINE 1 MG/3DAYS TD PT72: 1.0000 | MEDICATED_PATCH | Freq: Once | TRANSDERMAL | Status: DC

## 2013-03-23 MED ORDER — MEPERIDINE HCL 25 MG/ML IJ SOLN
INTRAMUSCULAR | Status: AC
  Filled 2013-03-23: qty 1

## 2013-03-23 MED ORDER — SCOPOLAMINE 1 MG/3DAYS TD PT72
MEDICATED_PATCH | TRANSDERMAL | Status: AC
  Administered 2013-03-23: 1.5 mg via TRANSDERMAL
  Filled 2013-03-23: qty 1

## 2013-03-23 MED ORDER — MEASLES, MUMPS & RUBELLA VAC ~~LOC~~ INJ: 0.5000 mL | INJECTION | Freq: Once | SUBCUTANEOUS | Status: DC

## 2013-03-23 MED ORDER — DIPHENHYDRAMINE HCL 50 MG/ML IJ SOLN: 12.5000 mg | INTRAMUSCULAR | Status: DC | PRN

## 2013-03-23 MED ORDER — FENTANYL CITRATE 0.05 MG/ML IJ SOLN
INTRAMUSCULAR | Status: DC | PRN
  Administered 2013-03-23: 25 ug via INTRATHECAL

## 2013-03-23 MED ORDER — SIMETHICONE 80 MG PO CHEW: 80.0000 mg | CHEWABLE_TABLET | ORAL | Status: DC | PRN

## 2013-03-23 MED ORDER — FENTANYL CITRATE 0.05 MG/ML IJ SOLN
25.0000 ug | INTRAMUSCULAR | Status: DC | PRN
  Administered 2013-03-23: 50 ug via INTRAVENOUS

## 2013-03-23 MED ORDER — OXYTOCIN 40 UNITS IN LACTATED RINGERS INFUSION - SIMPLE MED: 62.5000 mL/h | INTRAVENOUS | Status: AC

## 2013-03-23 MED ORDER — LACTATED RINGERS IV SOLN
INTRAVENOUS | Status: DC
  Administered 2013-03-23 (×3): via INTRAVENOUS

## 2013-03-23 MED ORDER — ZOLPIDEM TARTRATE 5 MG PO TABS: 5.0000 mg | ORAL_TABLET | Freq: Every evening | ORAL | Status: DC | PRN

## 2013-03-23 MED ORDER — PHENYLEPHRINE 40 MCG/ML (10ML) SYRINGE FOR IV PUSH (FOR BLOOD PRESSURE SUPPORT)
PREFILLED_SYRINGE | INTRAVENOUS | Status: AC
  Filled 2013-03-23: qty 5

## 2013-03-23 MED ORDER — ONDANSETRON HCL 4 MG PO TABS: 4.0000 mg | ORAL_TABLET | ORAL | Status: DC | PRN

## 2013-03-23 MED ORDER — METHYLERGONOVINE MALEATE 0.2 MG/ML IJ SOLN: 0.2000 mg | INTRAMUSCULAR | Status: DC | PRN

## 2013-03-23 MED ORDER — DIPHENHYDRAMINE HCL 25 MG PO CAPS
25.0000 mg | ORAL_CAPSULE | ORAL | Status: DC | PRN
  Filled 2013-03-23: qty 1

## 2013-03-23 MED ORDER — PHENYLEPHRINE 40 MCG/ML (10ML) SYRINGE FOR IV PUSH (FOR BLOOD PRESSURE SUPPORT)
PREFILLED_SYRINGE | INTRAVENOUS | Status: AC
  Filled 2013-03-23: qty 10

## 2013-03-23 MED ORDER — METOCLOPRAMIDE HCL 5 MG/ML IJ SOLN: 10.0000 mg | Freq: Three times a day (TID) | INTRAMUSCULAR | Status: DC | PRN

## 2013-03-23 MED ORDER — NALBUPHINE HCL 10 MG/ML IJ SOLN
5.0000 mg | INTRAMUSCULAR | Status: DC | PRN
  Filled 2013-03-23: qty 1

## 2013-03-23 MED ORDER — NALOXONE HCL 0.4 MG/ML IJ SOLN: 0.4000 mg | INTRAMUSCULAR | Status: DC | PRN

## 2013-03-23 MED ORDER — LACTATED RINGERS IV SOLN
INTRAVENOUS | Status: DC
  Administered 2013-03-23: 10:00:00 via INTRAVENOUS

## 2013-03-23 MED ORDER — NALOXONE HCL 1 MG/ML IJ SOLN
1.0000 ug/kg/h | INTRAMUSCULAR | Status: DC | PRN
  Filled 2013-03-23: qty 2

## 2013-03-23 MED ORDER — DIPHENHYDRAMINE HCL 50 MG/ML IJ SOLN: 25.0000 mg | INTRAMUSCULAR | Status: DC | PRN

## 2013-03-23 MED ORDER — TETANUS-DIPHTH-ACELL PERTUSSIS 5-2.5-18.5 LF-MCG/0.5 IM SUSP: 0.5000 mL | Freq: Once | INTRAMUSCULAR | Status: DC

## 2013-03-23 MED ORDER — WITCH HAZEL-GLYCERIN EX PADS: 1.0000 "application " | MEDICATED_PAD | CUTANEOUS | Status: DC | PRN

## 2013-03-23 MED ORDER — OXYCODONE-ACETAMINOPHEN 5-325 MG PO TABS
1.0000 | ORAL_TABLET | ORAL | Status: DC | PRN
  Administered 2013-03-24 (×5): 1 via ORAL
  Administered 2013-03-24: 2 via ORAL
  Administered 2013-03-25: 1 via ORAL
  Administered 2013-03-25 (×2): 2 via ORAL
  Administered 2013-03-25: 1 via ORAL
  Filled 2013-03-23: qty 1
  Filled 2013-03-23 (×3): qty 2
  Filled 2013-03-23 (×2): qty 1
  Filled 2013-03-23: qty 2
  Filled 2013-03-23 (×3): qty 1

## 2013-03-23 MED ORDER — FERROUS SULFATE 325 (65 FE) MG PO TABS
325.0000 mg | ORAL_TABLET | Freq: Two times a day (BID) | ORAL | Status: DC
  Administered 2013-03-24 – 2013-03-25 (×3): 325 mg via ORAL
  Filled 2013-03-23 (×3): qty 1

## 2013-03-23 MED ORDER — SENNOSIDES-DOCUSATE SODIUM 8.6-50 MG PO TABS
2.0000 | ORAL_TABLET | Freq: Every day | ORAL | Status: DC
  Administered 2013-03-23 – 2013-03-24 (×2): 2 via ORAL

## 2013-03-23 MED ORDER — BUPIVACAINE IN DEXTROSE 0.75-8.25 % IT SOLN
INTRATHECAL | Status: DC | PRN
  Administered 2013-03-23: 10.5 mg via INTRATHECAL

## 2013-03-23 MED ORDER — KETOROLAC TROMETHAMINE 30 MG/ML IJ SOLN
30.0000 mg | Freq: Once | INTRAMUSCULAR | Status: AC
  Administered 2013-03-23: 30 mg via INTRAVENOUS
  Filled 2013-03-23: qty 1

## 2013-03-23 MED ORDER — PHENYLEPHRINE HCL 10 MG/ML IJ SOLN
INTRAMUSCULAR | Status: DC | PRN
  Administered 2013-03-23: 80 ug via INTRAVENOUS
  Administered 2013-03-23: 40 ug via INTRAVENOUS
  Administered 2013-03-23 (×3): 80 ug via INTRAVENOUS
  Administered 2013-03-23 (×2): 40 ug via INTRAVENOUS
  Administered 2013-03-23 (×2): 80 ug via INTRAVENOUS
  Administered 2013-03-23: 40 ug via INTRAVENOUS
  Administered 2013-03-23 (×2): 80 ug via INTRAVENOUS

## 2013-03-23 MED ORDER — METHYLERGONOVINE MALEATE 0.2 MG PO TABS: 0.2000 mg | ORAL_TABLET | ORAL | Status: DC | PRN

## 2013-03-23 MED ORDER — OXYTOCIN 10 UNIT/ML IJ SOLN
INTRAMUSCULAR | Status: AC
  Filled 2013-03-23: qty 4

## 2013-03-23 MED ORDER — CEFAZOLIN SODIUM-DEXTROSE 2-3 GM-% IV SOLR
INTRAVENOUS | Status: AC
  Filled 2013-03-23: qty 50

## 2013-03-23 MED ORDER — FENTANYL CITRATE 0.05 MG/ML IJ SOLN
INTRAMUSCULAR | Status: AC
  Filled 2013-03-23: qty 2

## 2013-03-23 MED ORDER — MEPERIDINE HCL 25 MG/ML IJ SOLN
INTRAMUSCULAR | Status: DC | PRN
  Administered 2013-03-23 (×2): 12.5 mg via INTRAVENOUS

## 2013-03-23 MED ORDER — DIBUCAINE 1 % RE OINT: 1.0000 "application " | TOPICAL_OINTMENT | RECTAL | Status: DC | PRN

## 2013-03-23 MED ORDER — CEFAZOLIN SODIUM-DEXTROSE 2-3 GM-% IV SOLR
2.0000 g | INTRAVENOUS | Status: AC
  Administered 2013-03-23: 2 g via INTRAVENOUS

## 2013-03-23 MED ORDER — LANOLIN HYDROUS EX OINT: 1.0000 "application " | TOPICAL_OINTMENT | CUTANEOUS | Status: DC | PRN

## 2013-03-23 MED ORDER — ONDANSETRON HCL 4 MG/2ML IJ SOLN: 4.0000 mg | Freq: Three times a day (TID) | INTRAMUSCULAR | Status: DC | PRN

## 2013-03-23 MED ORDER — MEPERIDINE HCL 25 MG/ML IJ SOLN: 6.2500 mg | INTRAMUSCULAR | Status: DC | PRN

## 2013-03-23 MED ORDER — DIPHENHYDRAMINE HCL 25 MG PO CAPS: 25.0000 mg | ORAL_CAPSULE | Freq: Four times a day (QID) | ORAL | Status: DC | PRN

## 2013-03-23 MED ORDER — FENTANYL CITRATE 0.05 MG/ML IJ SOLN
INTRAMUSCULAR | Status: AC
  Administered 2013-03-23: 50 ug via INTRAVENOUS
  Filled 2013-03-23: qty 2

## 2013-03-23 SURGICAL SUPPLY — 37 items
APL SKNCLS STERI-STRIP NONHPOA (GAUZE/BANDAGES/DRESSINGS) ×1
BENZOIN TINCTURE PRP APPL 2/3 (GAUZE/BANDAGES/DRESSINGS) ×2 IMPLANT
BOOTIES KNEE HIGH SLOAN (MISCELLANEOUS) ×4 IMPLANT
CLAMP CORD UMBIL (MISCELLANEOUS) IMPLANT
CLOTH BEACON ORANGE TIMEOUT ST (SAFETY) ×2 IMPLANT
DRAIN JACKSON PRT FLT 10 (DRAIN) IMPLANT
DRAPE LG THREE QUARTER DISP (DRAPES) ×2 IMPLANT
DRSG OPSITE POSTOP 4X10 (GAUZE/BANDAGES/DRESSINGS) ×2 IMPLANT
DURAPREP 26ML APPLICATOR (WOUND CARE) ×2 IMPLANT
ELECT REM PT RETURN 9FT ADLT (ELECTROSURGICAL) ×2
ELECTRODE REM PT RTRN 9FT ADLT (ELECTROSURGICAL) ×1 IMPLANT
EVACUATOR SILICONE 100CC (DRAIN) IMPLANT
EXTRACTOR VACUUM M CUP 4 TUBE (SUCTIONS) IMPLANT
GLOVE BIO SURGEON STRL SZ 6.5 (GLOVE) ×1 IMPLANT
GLOVE BIOGEL PI IND STRL 7.0 (GLOVE) ×1 IMPLANT
GLOVE BIOGEL PI INDICATOR 7.0 (GLOVE) ×2
GLOVE ECLIPSE 6.5 STRL STRAW (GLOVE) ×2 IMPLANT
GOWN STRL REIN XL XLG (GOWN DISPOSABLE) ×4 IMPLANT
KIT ABG SYR 3ML LUER SLIP (SYRINGE) IMPLANT
NDL HYPO 25X5/8 SAFETYGLIDE (NEEDLE) IMPLANT
NEEDLE HYPO 22GX1.5 SAFETY (NEEDLE) ×2 IMPLANT
NEEDLE HYPO 25X5/8 SAFETYGLIDE (NEEDLE) IMPLANT
NS IRRIG 1000ML POUR BTL (IV SOLUTION) ×3 IMPLANT
PACK C SECTION WH (CUSTOM PROCEDURE TRAY) ×2 IMPLANT
PAD OB MATERNITY 4.3X12.25 (PERSONAL CARE ITEMS) ×2 IMPLANT
RTRCTR C-SECT PINK 25CM LRG (MISCELLANEOUS) ×2 IMPLANT
STRIP CLOSURE SKIN 1/2X4 (GAUZE/BANDAGES/DRESSINGS) ×2 IMPLANT
SUT CHROMIC GUT AB #0 18 (SUTURE) IMPLANT
SUT MNCRL AB 3-0 PS2 27 (SUTURE) ×2 IMPLANT
SUT SILK 2 0 FSL 18 (SUTURE) IMPLANT
SUT VIC AB 0 CTX 36 (SUTURE) ×4
SUT VIC AB 0 CTX36XBRD ANBCTRL (SUTURE) ×2 IMPLANT
SUT VIC AB 1 CT1 36 (SUTURE) ×4 IMPLANT
SYR 20CC LL (SYRINGE) ×2 IMPLANT
TOWEL OR 17X24 6PK STRL BLUE (TOWEL DISPOSABLE) ×6 IMPLANT
TRAY FOLEY CATH 14FR (SET/KITS/TRAYS/PACK) ×2 IMPLANT
WATER STERILE IRR 1000ML POUR (IV SOLUTION) ×2 IMPLANT

## 2013-03-23 NOTE — Op Note (Signed)
Preoperative diagnosis: Intrauterine pregnancy at 40 weeks and 1 days, previous cesarean section  Post operative diagnosis: Same  Anesthesia: Spinal  Anesthesiologist: Dr. Arby Barrette  Procedure: Repeat low transverse cesarean section  Surgeon: Dr. Dois Davenport Salvadore Valvano  Assistant: Nigel Bridgeman CNM  Estimated blood loss: 700 cc  Procedure:  After being informed of the planned procedure and possible complications including bleeding, infection, injury to other organs, informed consent is obtained. The patient is taken to OR #9 and given spinal anesthesia without complication. She is placed in the dorsal decubitus position with the pelvis tilted to the left. She is then prepped and draped in a sterile fashion. A Foley catheter is inserted in her bladder.  After assessing adequate level of anesthesia, we infiltrate the suprapubic area with 20 cc of Marcaine 0.25 and perform a Pfannenstiel incision which is brought down sharply to the fascia. The fascia is entered in a low transverse fashion. Linea alba is dissected. Peritoneum is entered in a midline fashion. An Alexis retractor is easily positioned.   The myometrium is then entered in a low transverse fashion, 2 cm above the vesico-uterine junction ; first with knife and then extended bluntly. Amniotic fluid is clear. We assist the birth of a female  infant in vertex presentation. Mouth and nose are suctioned. The baby is delivered. The cord is clamped and sectioned. The baby is given to the neonatologist present in the room.  The placenta is allowed to deliver spontaneously. It is complete and the cord has 3 vessels. Placenta is sent for cord blood donation Uterine revision is negative.  We proceed with closure of the myometrium in 2 layers: First with a running locked suture of 0 Vicryl, then with a Lembert suture of 0 Vicryl imbricating the first one. Hemostasis is completed with cauterization on peritoneal edges.  Both paracolic gutters are cleaned.  Both tubes and ovaries are assessed and normal. The pelvis is profusely irrigated with warm saline to confirm a satisfactory hemostasis.  Retractors and sponges are removed. Under fascia hemostasis is completed with cauterization. The fascia is then closed with 2 running sutures of 0 Vicryl meeting midline. The wound is irrigated with warm saline and hemostasis is completed with cauterization. The skin is closed with a subcuticular suture of 3-0 Monocryl and Steri-Strips.  Instrument and sponge count is complete x2. Estimated blood loss is 700 cc.  The procedure is well tolerated by the patient who is taken to recovery room in a well and stable condition.  female baby named [name] was born at 11:29 and received an Apgar of 9  at 1 minute and 9 at 5 minutes.    Specimen: Placenta sent to L & D   Adrik Khim A MD 7/23/20142:11 PM

## 2013-03-23 NOTE — Anesthesia Preprocedure Evaluation (Signed)
Anesthesia Evaluation  Patient identified by MRN, date of birth, ID band Patient awake    Reviewed: Allergy & Precautions, H&P , NPO status , Patient's Chart, lab work & pertinent test results  Airway Mallampati: II TM Distance: >3 FB Neck ROM: Full    Dental no notable dental hx. (+) Teeth Intact   Pulmonary  breath sounds clear to auscultation  Pulmonary exam normal       Cardiovascular negative cardio ROS  Rhythm:Regular Rate:Normal     Neuro/Psych  Headaches, Hearing loss Right ear negative psych ROS   GI/Hepatic Neg liver ROS, GERD-  Medicated and Controlled,  Endo/Other  negative endocrine ROS  Renal/GU negative Renal ROS  negative genitourinary   Musculoskeletal negative musculoskeletal ROS (+)   Abdominal   Peds  Hematology  (+) Blood dyscrasia, anemia ,   Anesthesia Other Findings   Reproductive/Obstetrics LGA Previous C/Section                           Anesthesia Physical Anesthesia Plan  ASA: II  Anesthesia Plan: Spinal   Post-op Pain Management:    Induction:   Airway Management Planned: Natural Airway  Additional Equipment:   Intra-op Plan:   Post-operative Plan: Extubation in OR  Informed Consent: I have reviewed the patients History and Physical, chart, labs and discussed the procedure including the risks, benefits and alternatives for the proposed anesthesia with the patient or authorized representative who has indicated his/her understanding and acceptance.   Dental advisory given  Plan Discussed with: CRNA, Anesthesiologist and Surgeon  Anesthesia Plan Comments:         Anesthesia Quick Evaluation

## 2013-03-23 NOTE — Anesthesia Procedure Notes (Signed)
Spinal  Patient location during procedure: OR Start time: 03/23/2013 10:55 AM End time: 03/23/2013 10:58 AM Staffing Anesthesiologist: Sandrea Hughs Performed by: anesthesiologist  Preanesthetic Checklist Completed: patient identified, site marked, surgical consent, pre-op evaluation, timeout performed, IV checked, risks and benefits discussed and monitors and equipment checked Spinal Block Patient position: sitting Prep: DuraPrep Patient monitoring: heart rate, cardiac monitor, continuous pulse ox and blood pressure Approach: midline Location: L3-4 Injection technique: single-shot Needle Needle type: Sprotte  Needle gauge: 24 G Needle length: 9 cm Needle insertion depth: 5 cm Assessment Sensory level: T4

## 2013-03-23 NOTE — Anesthesia Postprocedure Evaluation (Signed)
Anesthesia Post Note  Patient: [name]  Procedure(s) Performed: Procedure(s) (LRB): REPEAT CESAREAN SECTION (N/A)  Anesthesia type: Spinal  Patient location: PACU  Post pain: Pain level controlled  Post assessment: Post-op Vital signs reviewed  Last Vitals:  Filed Vitals:   03/23/13 1245  BP:   Pulse:   Temp:   Resp: 20    Post vital signs: Reviewed  Level of consciousness: awake  Complications: No apparent anesthesia complications

## 2013-03-23 NOTE — Interval H&P Note (Signed)
History and Physical Interval Note:  03/23/2013 10:28 AM  [name]  has presented today for surgery, with the diagnosis of Prior Cesarean Section; CPT [phone]  The various methods of treatment have been discussed with the patient and family. After consideration of risks, benefits and other options for treatment, the patient has consented to  Procedure(s) with comments: REPEAT CESAREAN SECTION (N/A) - Repeat C/S as a surgical intervention .  The patient's history has been reviewed, patient examined, no change in status, stable for surgery.  I have reviewed the patient's chart and labs.  Questions were answered to the patient's satisfaction.     Dawud Mays A

## 2013-03-23 NOTE — Transfer of Care (Signed)
Immediate Anesthesia Transfer of Care Note  Patient: [name]  Procedure(s) Performed: Procedure(s) with comments: REPEAT CESAREAN SECTION (N/A) - Repeat C/S  Patient Location: PACU  Anesthesia Type:Spinal  Level of Consciousness: awake, alert  and oriented  Airway & Oxygen Therapy: Patient Spontanous Breathing  Post-op Assessment: Report given to PACU RN  Post vital signs: Reviewed  Complications: No apparent anesthesia complications

## 2013-03-23 NOTE — Preoperative (Signed)
Beta Blockers   Reason not to administer Beta Blockers:Not Applicable 

## 2013-03-24 ENCOUNTER — Encounter (HOSPITAL_COMMUNITY): Payer: Self-pay | Admitting: Obstetrics and Gynecology

## 2013-03-24 DIAGNOSIS — Z98891 History of uterine scar from previous surgery: Secondary | ICD-10-CM

## 2013-03-24 LAB — CBC
HCT: 27.4 % — ABNORMAL LOW (ref 36.0–46.0)
Hemoglobin: 8.9 g/dL — ABNORMAL LOW (ref 12.0–15.0)
MCH: 25.7 pg — ABNORMAL LOW (ref 26.0–34.0)
MCHC: 32.5 g/dL (ref 30.0–36.0)
MCV: 79.2 fL (ref 78.0–100.0)

## 2013-03-24 MED ORDER — FERROUS SULFATE 325 (65 FE) MG PO TABS
325.0000 mg | ORAL_TABLET | Freq: Two times a day (BID) | ORAL | Status: DC
  Administered 2013-03-24: 325 mg via ORAL

## 2013-03-24 MED ORDER — POLYETHYLENE GLYCOL 3350 17 G PO PACK
17.0000 g | PACK | Freq: Every day | ORAL | Status: DC
  Administered 2013-03-24 – 2013-03-25 (×2): 17 g via ORAL
  Filled 2013-03-24 (×2): qty 1

## 2013-03-24 NOTE — Anesthesia Postprocedure Evaluation (Signed)
Anesthesia Post Note  Patient: [name]  Procedure(s) Performed: Procedure(s) (LRB): REPEAT CESAREAN SECTION (N/A)  Anesthesia type: SAB  Patient location: Mother/Baby  Post pain: Pain level controlled  Post assessment: Post-op Vital signs reviewed  Last Vitals:  Filed Vitals:   03/24/13 0810  BP: 89/58  Pulse: 106  Temp:   Resp:     Post vital signs: Reviewed  Level of consciousness: awake  Complications: No apparent anesthesia complications

## 2013-03-24 NOTE — Progress Notes (Addendum)
Subjective: Postpartum Day 1: Cesarean Delivery due to repeat Patient up ad lib, reports no syncope or dizziness. Feeding:  Breast Contraceptive plan:  Undecided at present Anticipating inpatient circumcision--had been undecided.  Hx of intermittent Fe use at home.  Objective: Vital signs in last 24 hours: Temp:  [97.6 F (36.4 C)-98.9 F (37.2 C)] 98.3 F (36.8 C) (07/24 0530) Pulse Rate:  [81-136] 106 (07/24 0810) Resp:  [16-20] 18 (07/24 0530) BP: (89-127)/(42-83) 89/58 mmHg (07/24 0810) SpO2:  [95 %-100 %] 99 % (07/24 0530) Weight:  [182 lb (82.555 kg)] 182 lb (82.555 kg) (07/23 1100)  Physical Exam:  General: alert Lochia: appropriate Uterine Fundus: firm Incision: Dressing CDI DVT Evaluation: No evidence of DVT seen on physical exam. Negative Homan's sign. JP drain:   NA   Recent Labs  03/22/13 1030 03/24/13 0610  HGB 10.2* 8.9*  HCT 31.1* 27.4*   Mild orthostatic change in pulse, but no hemodynamic instability  Assessment/Plan: Status post Cesarean section day 1 Anemia--stable hemodynamically Declines transfusion Requests Miralax to avoid constipation with Fe supplement. Continue Fe home supplementation. Continue current care.   Nigel Bridgeman 03/24/2013, 8:50 AM

## 2013-03-25 ENCOUNTER — Encounter (HOSPITAL_COMMUNITY): Payer: Self-pay | Admitting: *Deleted

## 2013-03-25 MED ORDER — FERROUS SULFATE 325 (65 FE) MG PO TABS: 325.0000 mg | ORAL_TABLET | Freq: Two times a day (BID) | ORAL | Status: AC

## 2013-03-25 MED ORDER — IBUPROFEN 600 MG PO TABS: 600.0000 mg | ORAL_TABLET | Freq: Four times a day (QID) | ORAL | Status: AC

## 2013-03-25 MED ORDER — OXYCODONE-ACETAMINOPHEN 5-325 MG PO TABS: 1.0000 | ORAL_TABLET | ORAL | Status: DC | PRN

## 2013-03-25 NOTE — Discharge Summary (Signed)
Cesarean Section Delivery Discharge Summary  [name]  DOB:    1981-03-19 MRN:    [id number] CSN:    [id number]  Date of admission:                  03/23/13  Date of discharge:                   03/25/13  Procedures this admission: Repeat C/S  Date of Delivery: 03/23/13 by Dr. Estanislado Pandy  Newborn Data:  Live born female  Birth Weight: 9 lb 7 oz (4281 g) APGAR: 9, 9  Home with mother. Name: "[name]" Circumcision Plan: Outpatient  History of Present Illness:  Ms. [name] is a 32 y.o. female, G2P2002, who presents at [redacted]w[redacted]d weeks gestation. The patient has been followed at the [facility] and Gy[facility]cology division of [facility].    Her pregnancy has been complicated by:  Patient Active Problem List   Diagnosis Date Noted  . S/P cesarean section 03/24/2013  . Migraine, unspecified, without mention of intractable migraine without mention of status migrainosus 03/21/2013  . Hearing loss in right ear 03/21/2013  . Hx LGA (large for gestational age) fetus 08/21/2012  . Previous cesarean section 07/24/2012  . Spotting in first trimester 07/20/2012    Hospital course:  The patient was admitted for scheduled repeat c/s.   Her postpartum course was not complicated. Hgb 8.9 hemodynamically stable, declines blood transfusion, discussed s/s of anemia and encouraged to call if sx occur.  She was discharged to home on postpartum day 2 doing well.  Feeding:  breast  Contraception:  Long-term plans for vesectomy, short-term condoms, interested in starting Nuvaring at [facility] visit.  Discharge hemoglobin:  Hemoglobin  Date Value Range Status  03/24/2013 8.9* 12.0 - 15.0 g/dL Final     HCT  Date Value Range Status  03/24/2013 27.4* 36.0 - 46.0 % Final    Discharge Physical Exam:   General: alert, cooperative and no distress Lochia: appropriate Uterine Fundus: firm Incision: healing well DVT Evaluation: No evidence of DVT seen on physical  exam. Negative Homan's sign.  Intrapartum Procedures: cesarean: low cervical, transverse Postpartum Procedures: none Complications-Operative and Postpartum: none  Discharge Diagnoses: Term Pregnancy-delivered and hemodynamically stable anemia  Discharge Information:  Activity:           pelvic rest Diet:                routine Medications: PNV, Ibuprofen, Iron, Percocet and Murelax Condition:      stable Instructions:  refer to practice specific booklet Discharge to: home  Follow-up Information   Follow up with [facility]. Schedule an appointment as soon as possible for a visit in 5 weeks. (Call with any questions or concerns.)    Contact information:   [address] [place] [address] [phone]       [name] 03/25/2013

## 2014-07-03 ENCOUNTER — Encounter (HOSPITAL_COMMUNITY): Payer: Self-pay | Admitting: *Deleted

## 2020-02-04 ENCOUNTER — Other Ambulatory Visit: Payer: Self-pay

## 2020-02-04 ENCOUNTER — Emergency Department (HOSPITAL_BASED_OUTPATIENT_CLINIC_OR_DEPARTMENT_OTHER)
Admission: EM | Admit: 2020-02-04 | Discharge: 2020-02-04 | Disposition: A | Discharge status: Home / Self Care | Payer: BC Managed Care – PPO | Attending: Emergency Medicine | Admitting: Emergency Medicine

## 2020-02-04 ENCOUNTER — Emergency Department (HOSPITAL_BASED_OUTPATIENT_CLINIC_OR_DEPARTMENT_OTHER): Payer: BC Managed Care – PPO

## 2020-02-04 ENCOUNTER — Emergency Department (HOSPITAL_BASED_OUTPATIENT_CLINIC_OR_DEPARTMENT_OTHER): Admission: EM | Admit: 2020-02-04 | Discharge: 2020-02-04 | Discharge status: Absent without leave | Payer: Self-pay

## 2020-02-04 ENCOUNTER — Encounter (HOSPITAL_BASED_OUTPATIENT_CLINIC_OR_DEPARTMENT_OTHER): Payer: Self-pay | Admitting: Emergency Medicine

## 2020-02-04 DIAGNOSIS — Y9289 Other specified places as the place of occurrence of the external cause: Secondary | ICD-10-CM | POA: Insufficient documentation

## 2020-02-04 DIAGNOSIS — Z91013 Allergy to seafood: Secondary | ICD-10-CM | POA: Diagnosis not present

## 2020-02-04 DIAGNOSIS — Y998 Other external cause status: Secondary | ICD-10-CM | POA: Diagnosis not present

## 2020-02-04 DIAGNOSIS — Z79899 Other long term (current) drug therapy: Secondary | ICD-10-CM | POA: Diagnosis not present

## 2020-02-04 DIAGNOSIS — Y93E5 Activity, floor mopping and cleaning: Secondary | ICD-10-CM | POA: Insufficient documentation

## 2020-02-04 DIAGNOSIS — W01198A Fall on same level from slipping, tripping and stumbling with subsequent striking against other object, initial encounter: Secondary | ICD-10-CM | POA: Insufficient documentation

## 2020-02-04 DIAGNOSIS — S62623A Displaced fracture of medial phalanx of left middle finger, initial encounter for closed fracture: Secondary | ICD-10-CM | POA: Diagnosis not present

## 2020-02-04 DIAGNOSIS — T1490XA Injury, unspecified, initial encounter: Secondary | ICD-10-CM

## 2020-02-04 DIAGNOSIS — S62629A Displaced fracture of medial phalanx of unspecified finger, initial encounter for closed fracture: Secondary | ICD-10-CM

## 2020-02-04 DIAGNOSIS — S6992XA Unspecified injury of left wrist, hand and finger(s), initial encounter: Secondary | ICD-10-CM | POA: Diagnosis present

## 2020-02-04 NOTE — ED Triage Notes (Signed)
L middle finger pain and swelling, she slipped while cleaning the hot tub.

## 2020-02-04 NOTE — Discharge Instructions (Addendum)
You can take Tylenol or Ibuprofen as directed for pain. You can alternate Tylenol and Ibuprofen every 4 hours. If you take Tylenol at 1pm, then you can take Ibuprofen at 5pm. Then you can take Tylenol again at 9pm.   Wear the splint for support and stabilization.  As we discussed, follow-up with Dr. Merlyn Lot.  Call his office and arrange for an appointment.  Return the emergency department for any worsening pain, numbness/weakness of the finger, discoloration or any other worsening or concerning symptoms.

## 2020-02-04 NOTE — ED Provider Notes (Signed)
[facility] EMERGENCY DEPARTMENT Provider Note   CSN: [id number] Arrival date & time: 02/04/20  1821     History Chief Complaint  Patient presents with  . Finger Injury    [name] is a 39 y.o. female past observe GERD who presents for evaluation of left middle and fifth digit pain after mechanical fall at about 5:30 PM.  Patient reports that she was cleaning her hot tub and she slipped and fell and hit her left hand.  Since then, she has had pain to the left middle digit in the left fifth digit.  She also reports that when she moves the left fifth digit it clicks and pops.  She has a little bit of numbness to the fifth digit.  Denies any other numbness.  Has not taken medications for the pain.  The history is provided by the patient.       Past Medical History:  Diagnosis Date  . Anemia   . Complication of anesthesia    spinal headache  . Frequent episodic tension-type headache   . GERD (gastroesophageal reflux disease)   . 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 Migraine   . Migraines     Patient Active Problem List   Diagnosis Date Noted  . S/P cesarean section 03/24/2013  . Migraine, unspecified, without mention of intractable migraine without mention of status migrainosus 03/21/2013  . Hearing loss in right ear 03/21/2013  . Hx LGA (large for gestational age) fetus 08/21/2012  . Previous cesarean section 07/24/2012  . Spotting in first trimester 07/20/2012    Past Surgical History:  Procedure Laterality Date  . CESAREAN SECTION    . CESAREAN SECTION N/A 03/23/2013   Procedure: REPEAT CESAREAN SECTION;  Surgeon: Alwyn Pea, MD;  Location: [facility];  Service: Obstetrics;  Laterality: N/A;  Repeat C/S  . STRABISMUS SURGERY  1987   nearsighted astigmatism     OB History    Gravida  2   Para  2   Term  2   Preterm      AB      Living  2     SAB      TAB      Ectopic      Multiple      Live Births  2           Family History  Problem Relation Age of Onset  . Hypertension Mother   . Mental illness Mother   . Hyperthyroidism Mother   . Cancer Father   . Hypertension Father   . Stroke Father   . Diabetes Sister        type 1  . Cancer Maternal Grandfather        prostate and lung   . Cancer Maternal Aunt        breast    Social History   Tobacco Use  . Smoking status: Never Smoker  . Smokeless tobacco: Never Used  Substance Use Topics  . Alcohol use: No  . Drug use: No    Home Medications Prior to Admission medications   Medication Sig Start Date End Date Taking? Authorizing Provider  cetirizine (ZYRTEC) 10 MG tablet Take 10 mg by mouth daily as needed.     [provider]  ferrous sulfate 325 (65 FE) MG tablet Take 1 tablet (325 mg total) by mouth 2 (two) times daily with a meal. 03/25/13   Oxley,  Victorino Dike, CNM  flintstones complete (FLINTSTONES) 60 MG chewable tablet Chew 1 tablet by mouth daily.    [provider]  ibuprofen (ADVIL,MOTRIN) 600 MG tablet Take 1 tablet (600 mg total) by mouth every 6 (six) hours. 03/25/13   Haroldine Laws, CNM  oxyCODONE-acetaminophen (PERCOCET/ROXICET) 5-325 MG per tablet Take 1-2 tablets by mouth every 4 (four) hours as needed. 03/25/13   Haroldine Laws, CNM  pantoprazole (PROTONIX) 40 MG tablet Take 40 mg by mouth daily. 07/28/12   Nigel Bridgeman, CNM  polyethylene glycol [facility] Ethelene Hal) packet Take 17 g by mouth daily as needed (constipation).    [provider]    Allergies    Clams [shellfish allergy]  Review of Systems   Review of Systems  Musculoskeletal:       Left hand pain  Neurological: Positive for numbness.  All other systems reviewed and are negative.   Physical Exam Updated Vital Signs BP 134/68   Pulse 75   Temp 99 F (37.2 C) (Oral)   Resp 18   Ht 5\' 5"  (1.651 m)   Wt 81.6 kg   LMP 01/25/2020   SpO2 99%   BMI 29.95 kg/m   Physical Exam Vitals and nursing note  reviewed.  Constitutional:      Appearance: She is well-developed.  HENT:     Head: Normocephalic and atraumatic.  Eyes:     General: No scleral icterus.       Right eye: No discharge.        Left eye: No discharge.     Conjunctiva/sclera: Conjunctivae normal.  Cardiovascular:     Pulses:          Radial pulses are 2+ on the right side and 2+ on the left side.  Pulmonary:     Effort: Pulmonary effort is normal.  Musculoskeletal:     Comments: Tenderness palpation noted left middle finger.  Flexion/tension intact but with some subjective reports of pain.  No snuffbox tenderness.  Tenderness palpation of the MCP of the left fifth digit.  No deformity or crepitus noted.  She does have some laxity with flexion/extension.  She can easily make a fist with all 5 digits without any difficulty.  She can easily extend all 5 digits.  Skin:    General: Skin is warm and dry.     Comments: Good distal cap refill.  LUE is not dusky in appearance or cool to touch.  Neurological:     Mental Status: She is alert.  Psychiatric:        Speech: Speech normal.        Behavior: Behavior normal.     ED Results / Procedures / Treatments   Labs (all labs ordered are listed, but only abnormal results are displayed) Labs Reviewed - No data to display  EKG None  Radiology DG Hand Complete Left  Result Date: 02/04/2020 CLINICAL DATA:  LEFT hand injury, slipped while cleaning the hot tub and jammed third and fifth fingers of LEFT hand, pain and decreased range of motion EXAM: LEFT HAND - COMPLETE 3+ VIEW COMPARISON:  None FINDINGS: Osseous mineralization normal for technique. Joint spaces preserved. Nondisplaced intra-articular volar plate avulsion fracture at base of middle phalanx LEFT middle finger. No additional fracture, dislocation, or bone destruction. IMPRESSION: Nondisplaced intra-articular volar plate avulsion fracture at base of middle phalanx LEFT middle finger. Electronically Signed   By: 04/05/2020 M.D.   On: 02/04/2020 19:16    Procedures Procedures (including critical care  time)  Medications Ordered in ED Medications - No data to display  ED Course  I have reviewed the triage vital signs and the nursing notes.  Pertinent labs & imaging results that were available during my care of the patient were reviewed by me and considered in my medical decision making (see chart for details).    MDM Rules/Calculators/A&P                      39 year old female who presents for evaluation of left middle and left fifth digit pain that began after mechanical fall that occurred earlier this evening.  She does have some numbness noted to the right fifth digit.  She can flex and extend but does have some worsening pain with movement.  On initially arrival, she is afebrile, nontoxic-appearing.  Vital signs are stable.  Patient does have good pulses and cap refill.  She does have some sensation deficits noted to the right fifth digit.  Concern for fracture versus dislocation.  X-rays ordered at triage.  X-ray shows nondisplaced intra-articular volar plate avulsion fracture at the base of the middle phalanx of the left finger.  No evidence of dislocation or other bony abnormality.  I discussed results with patient.  I discussed with her that most likely that popping and clicking sensation on the right fifth digit is most likely some laxity.  At this time, she has no signs of dislocation.   We will plan to splint to support it as this is likely a sprain from the fall.  We will have her follow-up with hand doctor for further evaluation of her fracture. At this time, patient exhibits no emergent life-threatening condition that require further evaluation in ED or admission. Patient had ample opportunity for questions and discussion. All patient's questions were answered with full understanding. Strict return precautions discussed. Patient expresses understanding and agreement to plan.   Portions of this  note were generated with Scientist, clinical (histocompatibility and immunogenetics). Dictation errors may occur despite best attempts at proofreading.  Final Clinical Impression(s) / ED Diagnoses Final diagnoses:  Closed avulsion fracture of middle phalanx of finger, initial encounter    Rx / DC Orders ED Discharge Orders    None       Maxwell Caul, PA-C 02/05/20 0019    Rolan Bucco, MD 02/05/20 1501

## 2020-02-26 ENCOUNTER — Emergency Department (HOSPITAL_COMMUNITY)
Admission: EM | Admit: 2020-02-26 | Discharge: 2020-02-26 | Disposition: A | Discharge status: Home / Self Care | Payer: BC Managed Care – PPO | Attending: Emergency Medicine | Admitting: Emergency Medicine

## 2020-02-26 ENCOUNTER — Encounter (HOSPITAL_COMMUNITY): Payer: Self-pay | Admitting: Emergency Medicine

## 2020-02-26 ENCOUNTER — Other Ambulatory Visit: Payer: Self-pay

## 2020-02-26 ENCOUNTER — Emergency Department (HOSPITAL_COMMUNITY): Payer: BC Managed Care – PPO

## 2020-02-26 DIAGNOSIS — R109 Unspecified abdominal pain: Secondary | ICD-10-CM | POA: Diagnosis present

## 2020-02-26 DIAGNOSIS — N200 Calculus of kidney: Secondary | ICD-10-CM | POA: Diagnosis not present

## 2020-02-26 LAB — URINALYSIS, ROUTINE W REFLEX MICROSCOPIC
Bilirubin Urine: NEGATIVE
Glucose, UA: NEGATIVE mg/dL
Ketones, ur: 20 mg/dL — AB
Leukocytes,Ua: NEGATIVE
Nitrite: NEGATIVE
Protein, ur: NEGATIVE mg/dL
Specific Gravity, Urine: 1.014 (ref 1.005–1.030)
pH: 6 (ref 5.0–8.0)

## 2020-02-26 LAB — CBC
HCT: 38.1 % (ref 36.0–46.0)
Hemoglobin: 13.3 g/dL (ref 12.0–15.0)
MCH: 29.4 pg (ref 26.0–34.0)
MCHC: 34.9 g/dL (ref 30.0–36.0)
MCV: 84.1 fL (ref 80.0–100.0)
Platelets: 335 10*3/uL (ref 150–400)
RBC: 4.53 MIL/uL (ref 3.87–5.11)
RDW: 12.3 % (ref 11.5–15.5)
WBC: 9.2 10*3/uL (ref 4.0–10.5)
nRBC: 0 % (ref 0.0–0.2)

## 2020-02-26 LAB — I-STAT BETA HCG BLOOD, ED (MC, WL, AP ONLY): I-stat hCG, quantitative: <5 m[IU]/mL (ref <5)

## 2020-02-26 LAB — BASIC METABOLIC PANEL
Anion gap: 14 (ref 5–15)
BUN: 15 mg/dL (ref 6–20)
CO2: 19 mmol/L — ABNORMAL LOW (ref 22–32)
Calcium: 9.3 mg/dL (ref 8.9–10.3)
Chloride: 105 mmol/L (ref 98–111)
Creatinine, Ser: 0.8 mg/dL (ref 0.44–1.00)
GFR calc Af Amer: >60 mL/min (ref >60)
GFR calc non Af Amer: >60 mL/min (ref >60)
Glucose, Bld: 138 mg/dL — ABNORMAL HIGH (ref 70–99)
Potassium: 3.5 mmol/L (ref 3.5–5.1)
Sodium: 138 mmol/L (ref 135–145)

## 2020-02-26 MED ORDER — ONDANSETRON 4 MG PO TBDP: 4.0000 mg | ORAL_TABLET | Freq: Once | ORAL | Status: DC

## 2020-02-26 MED ORDER — HYDROMORPHONE HCL 1 MG/ML IJ SOLN
INTRAMUSCULAR | Status: AC
  Administered 2020-02-26: 1 mg via INTRAVENOUS
  Filled 2020-02-26: qty 1

## 2020-02-26 MED ORDER — HYDROMORPHONE HCL 1 MG/ML IJ SOLN: 1.0000 mg | Freq: Once | INTRAMUSCULAR | Status: AC

## 2020-02-26 MED ORDER — FENTANYL CITRATE (PF) 100 MCG/2ML IJ SOLN
50.0000 ug | INTRAMUSCULAR | Status: DC | PRN
  Administered 2020-02-26: 50 ug via INTRAVENOUS
  Filled 2020-02-26: qty 2

## 2020-02-26 MED ORDER — HYDROMORPHONE HCL 1 MG/ML IJ SOLN
1.0000 mg | Freq: Once | INTRAMUSCULAR | Status: AC
  Administered 2020-02-26: 1 mg via INTRAVENOUS
  Filled 2020-02-26: qty 1

## 2020-02-26 MED ORDER — OXYCODONE-ACETAMINOPHEN 5-325 MG PO TABS: 1.0000 | ORAL_TABLET | ORAL | 0 refills | Status: AC | PRN

## 2020-02-26 MED ORDER — ONDANSETRON 4 MG PO TBDP: 4.0000 mg | ORAL_TABLET | Freq: Three times a day (TID) | ORAL | 0 refills | Status: AC | PRN

## 2020-02-26 MED ORDER — ONDANSETRON HCL 4 MG/2ML IJ SOLN
4.0000 mg | Freq: Once | INTRAMUSCULAR | Status: AC
  Administered 2020-02-26: 4 mg via INTRAVENOUS
  Filled 2020-02-26: qty 2

## 2020-02-26 MED ORDER — TAMSULOSIN HCL 0.4 MG PO CAPS: 0.4000 mg | ORAL_CAPSULE | Freq: Every day | ORAL | 0 refills | Status: AC

## 2020-02-26 MED ORDER — KETOROLAC TROMETHAMINE 30 MG/ML IJ SOLN
30.0000 mg | Freq: Once | INTRAMUSCULAR | Status: AC
  Administered 2020-02-26: 30 mg via INTRAVENOUS
  Filled 2020-02-26: qty 1

## 2020-02-26 NOTE — ED Notes (Signed)
Patient provided with urine strainers.

## 2020-02-26 NOTE — Discharge Instructions (Addendum)
Return if any problems.

## 2020-02-26 NOTE — ED Provider Notes (Signed)
[facility]-EMERGENCY DEPT Provider Note   CSN: [id number] Arrival date & time: 02/26/20  0531     History Chief Complaint  Patient presents with  . Flank Pain    [name] is a 39 y.o. female.  The history is provided by the patient. No language interpreter was used.  Flank Pain This is a new problem. The current episode started 3 to 5 hours ago. The problem occurs constantly. The problem has been gradually worsening. Associated symptoms include abdominal pain. Nothing aggravates the symptoms. Nothing relieves the symptoms. She has tried nothing for the symptoms. The treatment provided no relief.  Pt complains of sudden onset of severe left flank pain.  Pt has had a previous kidney stone.  Pt given zofran and fentanyl at triage with no relief      Past Medical History:  Diagnosis Date  . Anemia   . Complication of anesthesia    spinal headache  . Frequent episodic tension-type headache   . GERD (gastroesophageal reflux disease)   . 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 Migraine   . Migraines     Patient Active Problem List   Diagnosis Date Noted  . S/P cesarean section 03/24/2013  . Migraine, unspecified, without mention of intractable migraine without mention of status migrainosus 03/21/2013  . Hearing loss in right ear 03/21/2013  . Hx LGA (large for gestational age) fetus 08/21/2012  . Previous cesarean section 07/24/2012  . Spotting in first trimester 07/20/2012    Past Surgical History:  Procedure Laterality Date  . CESAREAN SECTION    . CESAREAN SECTION N/A 03/23/2013   Procedure: REPEAT CESAREAN SECTION;  Surgeon: Esmeralda Arthur, MD;  Location: [facility];  Service: Obstetrics;  Laterality: N/A;  Repeat C/S  . STRABISMUS SURGERY  1987   nearsighted astigmatism     OB History    Gravida  2   Para  2   Term  2   Preterm      AB      Living  2     SAB      TAB      Ectopic      Multiple        Live Births  2           Family History  Problem Relation Age of Onset  . Hypertension Mother   . Mental illness Mother   . Hyperthyroidism Mother   . Cancer Father   . Hypertension Father   . Stroke Father   . Diabetes Sister        type 1  . Cancer Maternal Grandfather        prostate and lung   . Cancer Maternal Aunt        breast    Social History   Tobacco Use  . Smoking status: Never Smoker  . Smokeless tobacco: Never Used  Substance Use Topics  . Alcohol use: No  . Drug use: No    Home Medications Prior to Admission medications   Medication Sig Start Date End Date Taking? Authorizing Provider  cetirizine (ZYRTEC) 10 MG tablet Take 10 mg by mouth daily as needed.     [provider]  ferrous sulfate 325 (65 FE) MG tablet Take 1 tablet (325 mg total) by mouth 2 (two) times daily with a meal. 03/25/13   Haroldine Laws, CNM  flintstones complete (FLINTSTONES) 60 MG chewable tablet Chew  1 tablet by mouth daily.    [provider]  ibuprofen (ADVIL,MOTRIN) 600 MG tablet Take 1 tablet (600 mg total) by mouth every 6 (six) hours. 03/25/13   Haroldine Laws, CNM  oxyCODONE-acetaminophen (PERCOCET/ROXICET) 5-325 MG per tablet Take 1-2 tablets by mouth every 4 (four) hours as needed. 03/25/13   Haroldine Laws, CNM  pantoprazole (PROTONIX) 40 MG tablet Take 40 mg by mouth daily. 07/28/12   Nigel Bridgeman, CNM  polyethylene glycol [facility] / Ethelene Hal) packet Take 17 g by mouth daily as needed (constipation).    [provider]    Allergies    Clam shell, Nickel, Other, and Clams [shellfish allergy]  Review of Systems   Review of Systems  Gastrointestinal: Positive for abdominal pain.  Genitourinary: Positive for flank pain.  All other systems reviewed and are negative.   Physical Exam Updated Vital Signs BP 115/71   Pulse 95   Temp (!) 97.3 F (36.3 C) (Oral)   Resp 16   Ht 5\' 5"  (1.651 m)   Wt 81.6 kg   SpO2 98%   BMI 29.95 kg/m    Physical Exam Vitals and nursing note reviewed.  Constitutional:      Appearance: She is well-developed.  HENT:     Head: Normocephalic.  Cardiovascular:     Rate and Rhythm: Normal rate.  Pulmonary:     Effort: Pulmonary effort is normal.  Abdominal:     General: There is no distension.     Tenderness: There is left CVA tenderness.  Musculoskeletal:        General: Normal range of motion.     Cervical back: Normal range of motion.  Skin:    General: Skin is warm.  Neurological:     General: No focal deficit present.     Mental Status: She is alert and oriented to person, place, and time.  Psychiatric:        Mood and Affect: Mood normal.     ED Results / Procedures / Treatments   Labs (all labs ordered are listed, but only abnormal results are displayed) Labs Reviewed  URINALYSIS, ROUTINE W REFLEX MICROSCOPIC - Abnormal; Notable for the following components:      Result Value   Hgb urine dipstick SMALL (*)    Ketones, ur 20 (*)    Bacteria, UA RARE (*)    All other components within normal limits  BASIC METABOLIC PANEL - Abnormal; Notable for the following components:   CO2 19 (*)    Glucose, Bld 138 (*)    All other components within normal limits  URINE CULTURE  CBC  I-STAT BETA HCG BLOOD, ED ([facility], [facility], [facility] ONLY)    EKG None  Radiology CT Renal Stone Study  Result Date: 02/26/2020 CLINICAL DATA:  Flank pain. EXAM: CT ABDOMEN AND PELVIS WITHOUT CONTRAST TECHNIQUE: Multidetector CT imaging of the abdomen and pelvis was performed following the standard protocol without IV contrast. COMPARISON:  None. FINDINGS: Lower chest: No acute abnormality. Hepatobiliary: Hepatic steatosis. Focal fatty sparing adjacent to the gallbladder fossa and in the porta hepatis. The gallbladder is normal. No other abnormalities. Pancreas: Unremarkable. No pancreatic ductal dilatation or surrounding inflammatory changes. Spleen: Normal in size without focal abnormality. Adrenals/Urinary  Tract: Adrenal glands are normal. No stones seen in the left kidney. At least 2 small stones are seen in the right kidney measuring approximately 2 mm. Left-sided hydronephrosis is identified with very mild perinephric stranding. Left ureterectasis is identified, extending to the left UVJ.  There is a 3 mm stone in the left UVJ accounting for the left ureterectasis and hydronephrosis. No hydronephrosis on the right. The right ureter is normal with no stones. The bladder is unremarkable other than the left UVJ stone. Stomach/Bowel: There is a small hiatal hernia. The stomach and small bowel are otherwise normal. The colon is normal. The appendix is not seen but there is no secondary evidence of appendicitis. Vascular/Lymphatic: No significant vascular findings are present. No enlarged abdominal or pelvic lymph nodes. Reproductive: Uterus and bilateral adnexa are unremarkable. Other: Fat containing periumbilical hernia.  No free air free fluid. Musculoskeletal: No acute or significant osseous findings. IMPRESSION: 1. There is a 3 mm stone at the left UVJ resulting in hydronephrosis, mild perinephric stranding, and ureterectasis. 2. No other stone seen in the left kidney. Two small stones are seen in the right kidney. 3. Small hiatal hernia. 4. The appendix is not seen but there is no secondary evidence of appendicitis. 5. Small fat containing periumbilical hernia. 6. Hepatic steatosis. Electronically Signed   By: Dorise Bullion III M.D   On: 02/26/2020 07:38    Procedures Procedures (including critical care time)  Medications Ordered in ED Medications  fentaNYL (SUBLIMAZE) injection 50 mcg (50 mcg Intravenous Given 02/26/20 0630)  ondansetron (ZOFRAN) injection 4 mg (4 mg Intravenous Given 02/26/20 0347)  HYDROmorphone (DILAUDID) injection 1 mg (1 mg Intravenous Given 02/26/20 0743)  ketorolac (TORADOL) 30 MG/ML injection 30 mg (30 mg Intravenous Given 02/26/20 0747)    ED Course  I have reviewed the triage  vital signs and the nursing notes.  Pertinent labs & imaging results that were available during my care of the patient were reviewed by me and considered in my medical decision making (see chart for details).    MDM Rules/Calculators/A&P                          MDM:  Pt given fentanyl at triage.  Pt reports continued pain.  Pt given dilaudid and toradol  Pt reevaluated.  Pt feels better after iv dilaudid.  Ct scan shows 17mm stone at the left UVJ.  Pt reevaluated and has some pain returning.  Pt given second dosage of Dilaudid.  Pt reports improved pain.  Pt advised to follow up with Urologist for evaluation.  Strain urine  Pt given rx for percocet, zofran and flomax   Final Clinical Impression(s) / ED Diagnoses Final diagnoses:  Kidney stone    Rx / DC Orders ED Discharge Orders         Ordered    oxyCODONE-acetaminophen (PERCOCET) 5-325 MG tablet  Every 4 hours PRN     Discontinue  Reprint     02/26/20 0950    ondansetron (ZOFRAN ODT) 4 MG disintegrating tablet  Every 8 hours PRN     Discontinue  Reprint     02/26/20 0950    tamsulosin (FLOMAX) 0.4 MG CAPS capsule  Daily     Discontinue  Reprint     02/26/20 0950        An After Visit Summary was printed and given to the patient.    Fransico Meadow, [place] 02/26/20 1448    Daleen Bo, MD 02/29/20 1640

## 2020-02-26 NOTE — ED Triage Notes (Signed)
Patient states that she woke up at 0330 with left back pain wrapping around to front lower abdomen. Patient is screaming and hollering.

## 2020-02-26 NOTE — ED Notes (Signed)
Pt to restroom

## 2020-02-26 NOTE — ED Notes (Signed)
Pt provided water.  

## 2020-02-27 LAB — URINE CULTURE: Culture: NO GROWTH

## 2021-12-08 IMAGING — CR DG HAND COMPLETE 3+V*L*
3 series · 3 of 3 positions shown · non-contrast
Comparison: None

CLINICAL DATA: LEFT hand injury, slipped while cleaning the hot tub
and jammed third and fifth fingers of LEFT hand, pain and decreased
range of motion

EXAM:
LEFT HAND - COMPLETE 3+ VIEW

[x hand pa left]
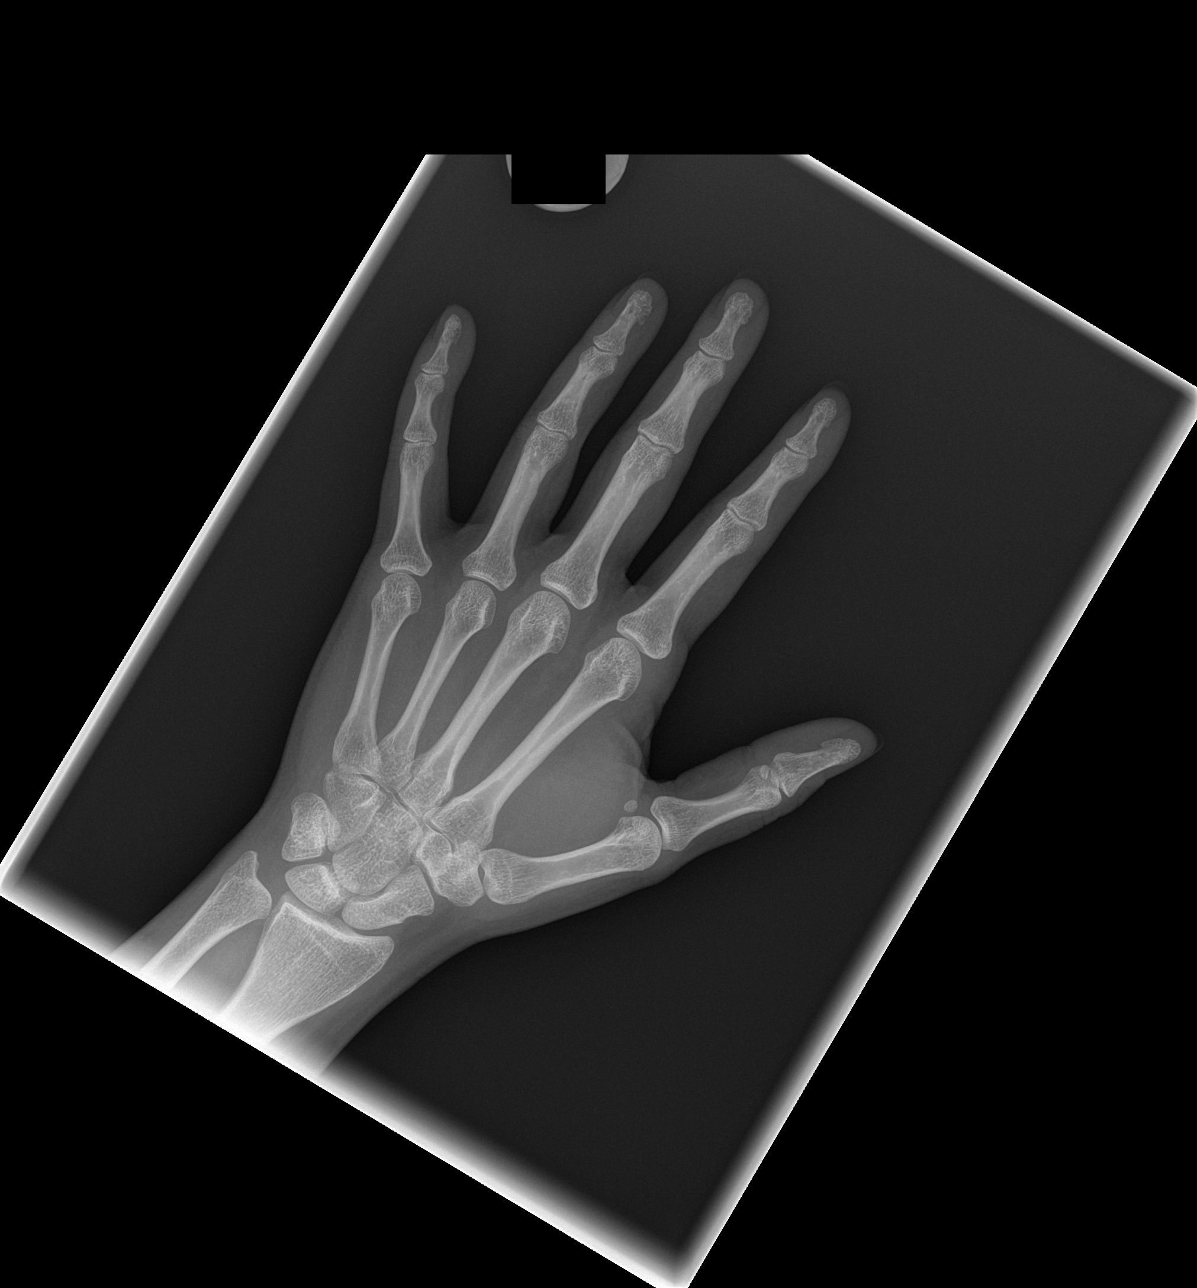

[x hand oblique left]
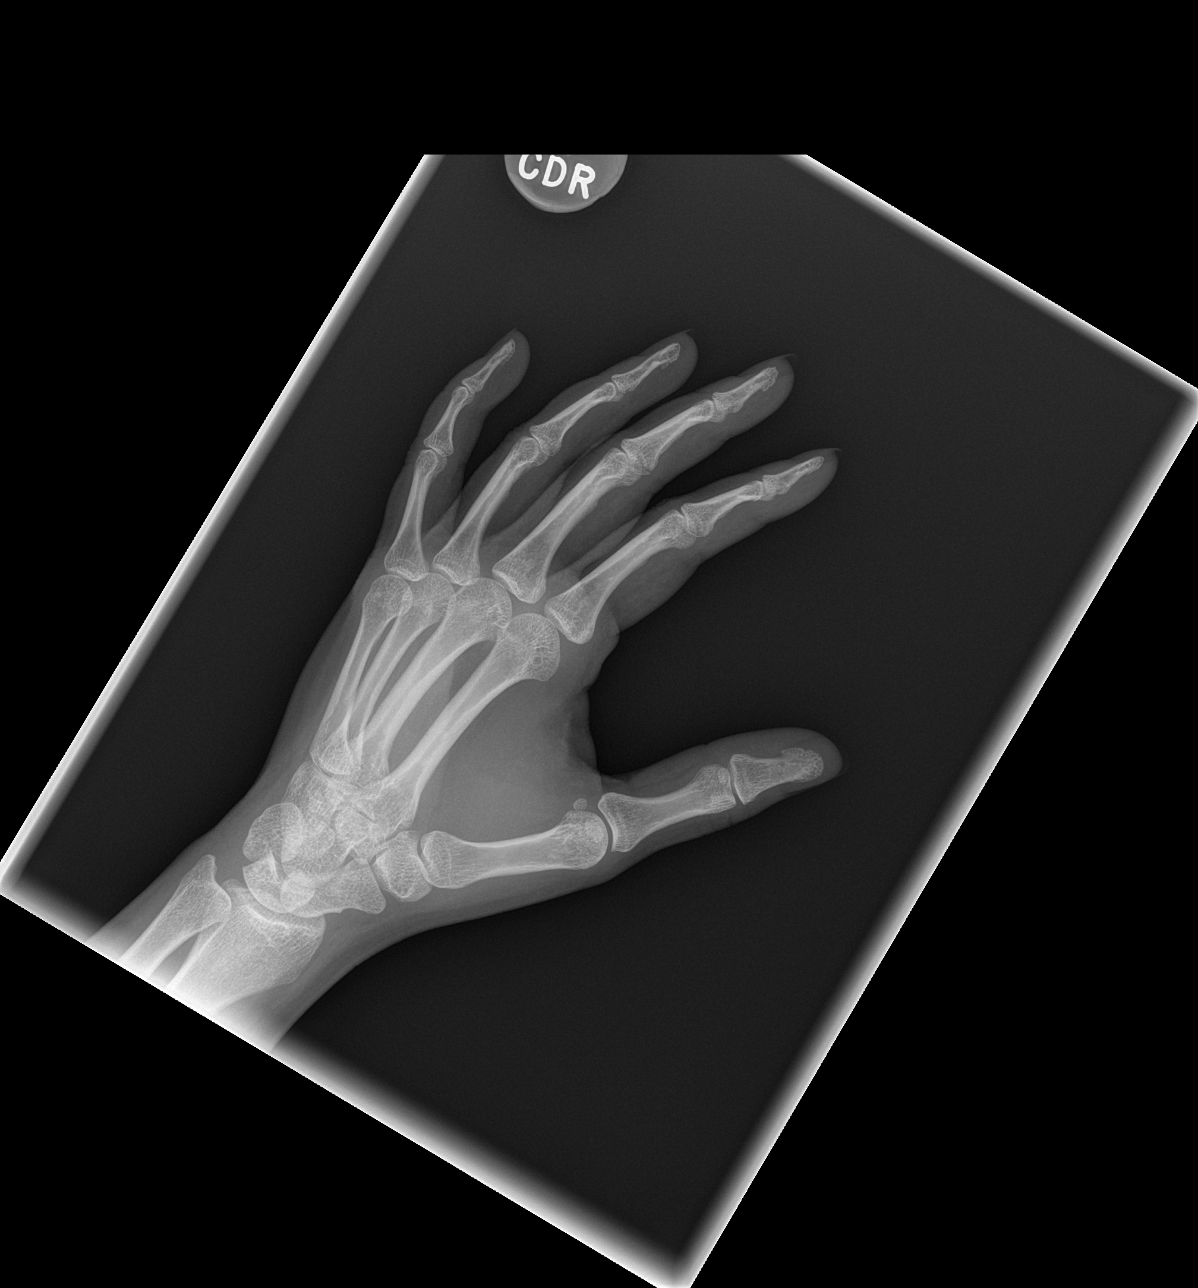

[x hand lat left]
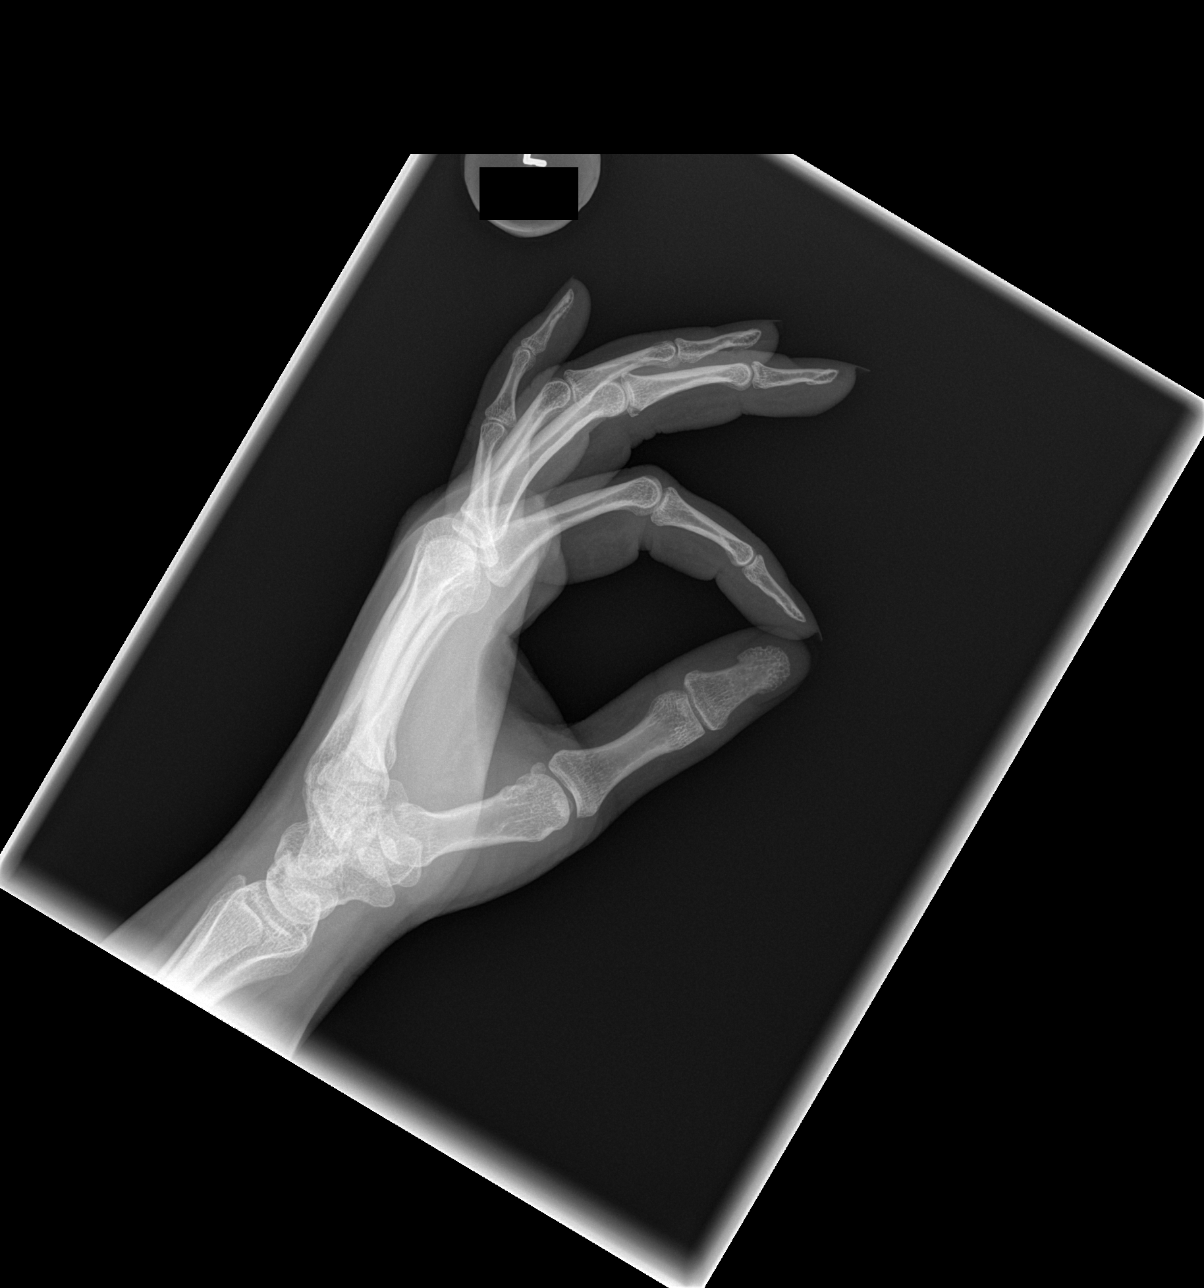

[3 of 3 positions shown; findings below may reference images not displayed]

FINDINGS: Osseous mineralization normal for technique.

Joint spaces preserved.

Nondisplaced intra-articular volar plate avulsion fracture at base
of middle phalanx LEFT middle finger.

No additional fracture, dislocation, or bone destruction.
IMPRESSION: Nondisplaced intra-articular volar plate avulsion fracture at base
of middle phalanx LEFT middle finger.

## 2021-12-30 IMAGING — CT CT RENAL STONE PROTOCOL
2 of 4 series · 15 of 46 positions shown, 17 images · non-contrast
Comparison: None.

CLINICAL DATA: Flank pain.

EXAM:
CT ABDOMEN AND PELVIS WITHOUT CONTRAST
TECHNIQUE: Multidetector CT imaging of the abdomen and pelvis was performed
following the standard protocol without IV contrast.

[Series 2: axial st · axial · 0.80mm/px · z∈[-511,-81]mm · 12 of 98 slices shown, 14 images]
[im 6/98  soft-tissue]
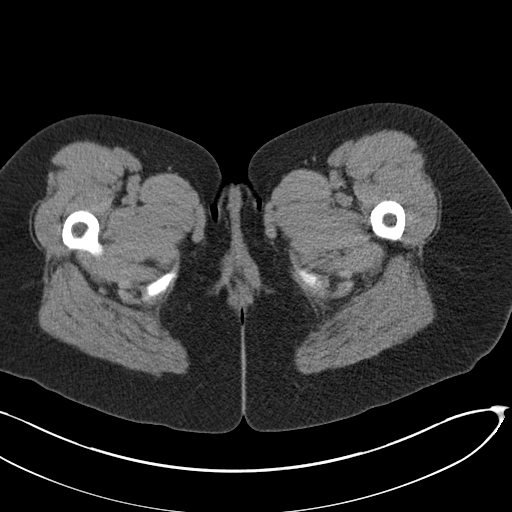
[im 6/98  bone]
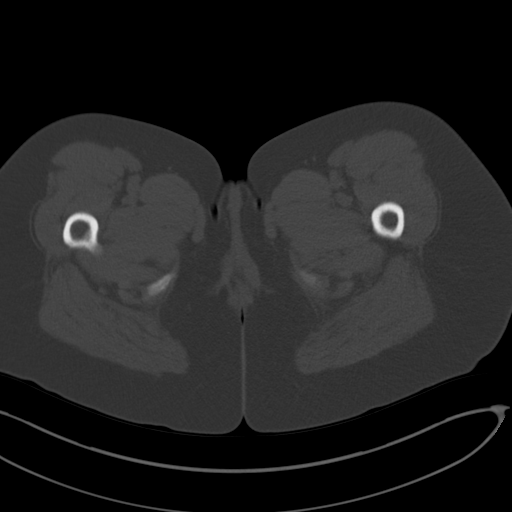
[im 17/98  soft-tissue]
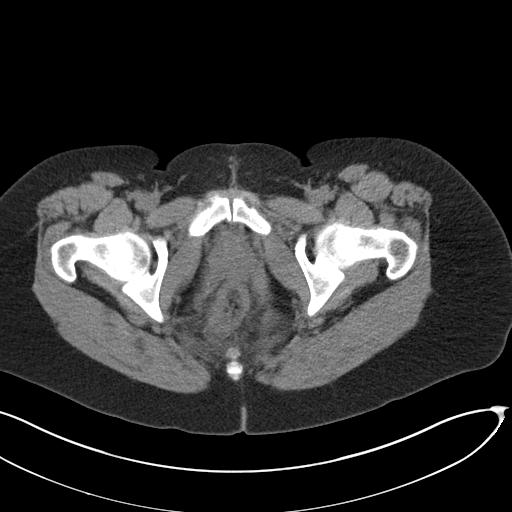
[im 22/98  soft-tissue]
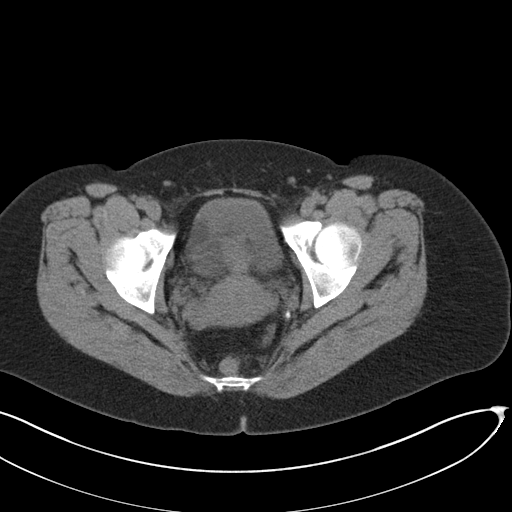
[im 27/98  soft-tissue]
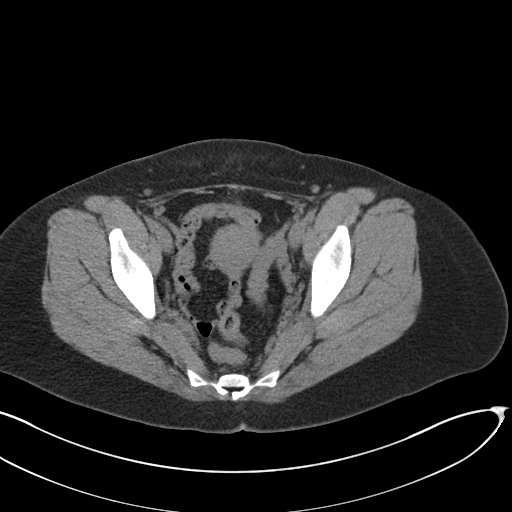
[im 38/98  soft-tissue]
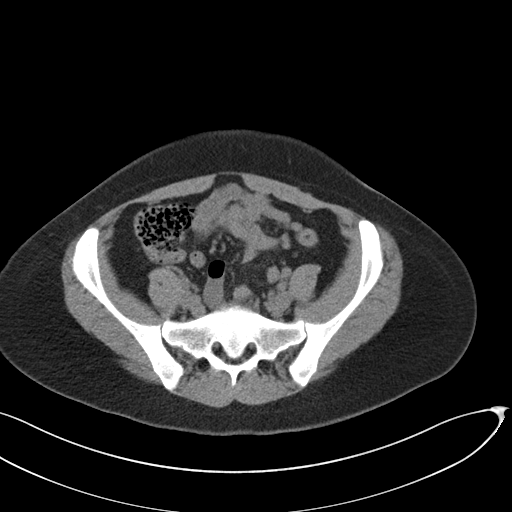
[im 44/98  soft-tissue]
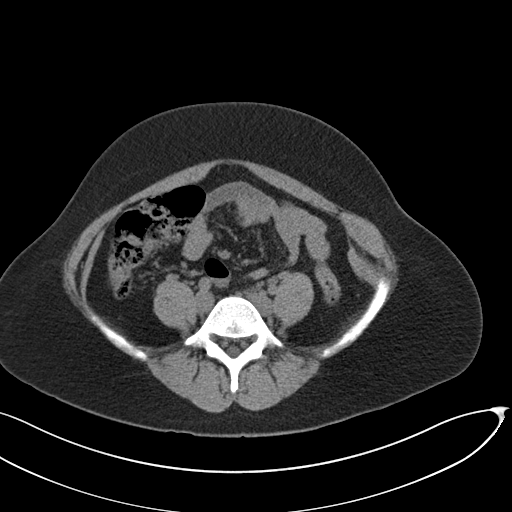
[im 54/98  soft-tissue]
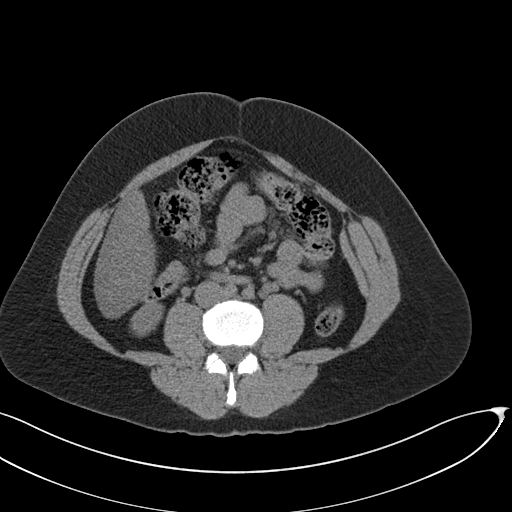
[im 60/98  soft-tissue]
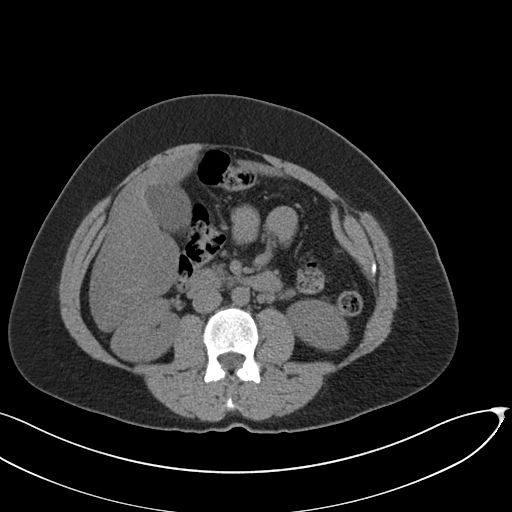
[im 71/98  soft-tissue]
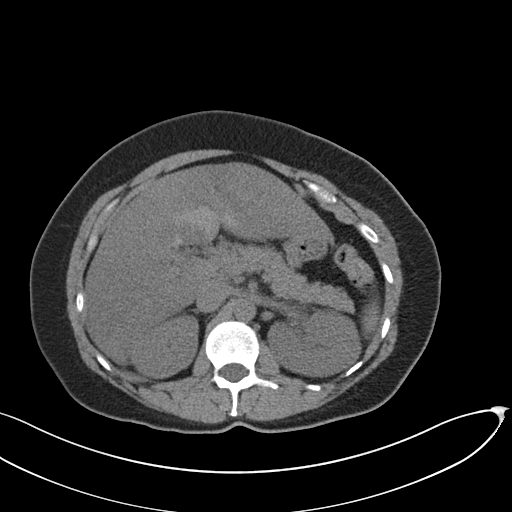
[im 71/98  bone]
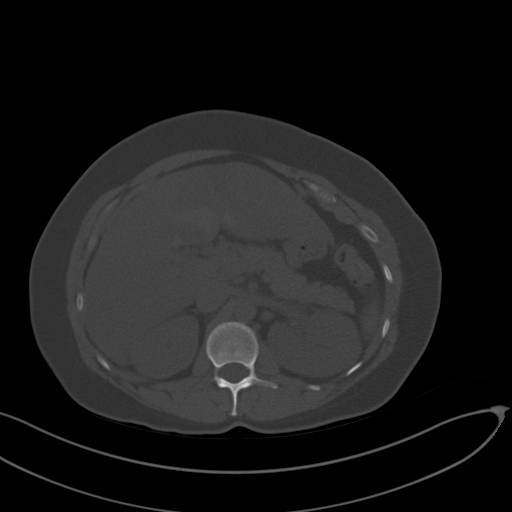
[im 76/98  soft-tissue]
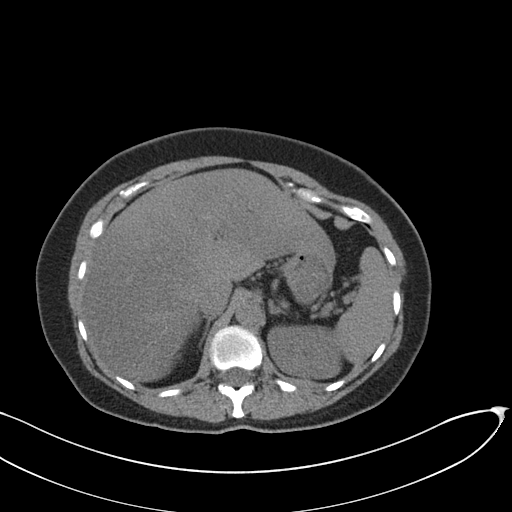
[im 81/98  soft-tissue]
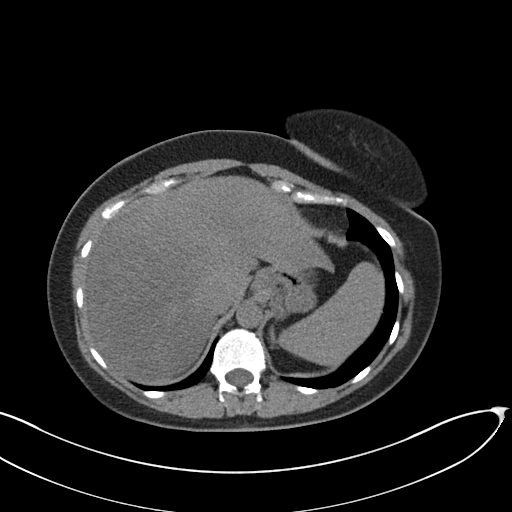
[im 92/98  soft-tissue]
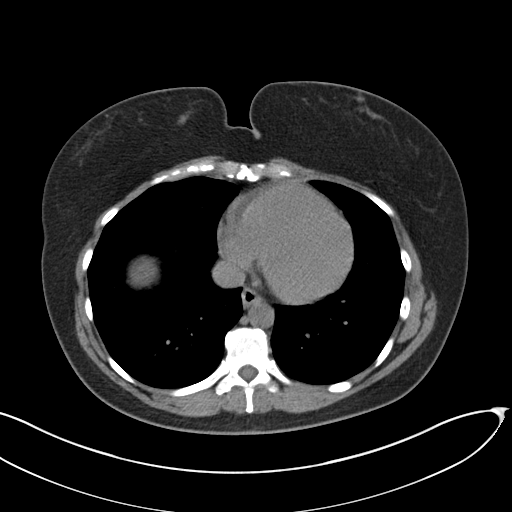

[Series 5: coronal · coronal · 0.81mm/px · 3 of 151 slices shown]
[im 51/151  soft-tissue]
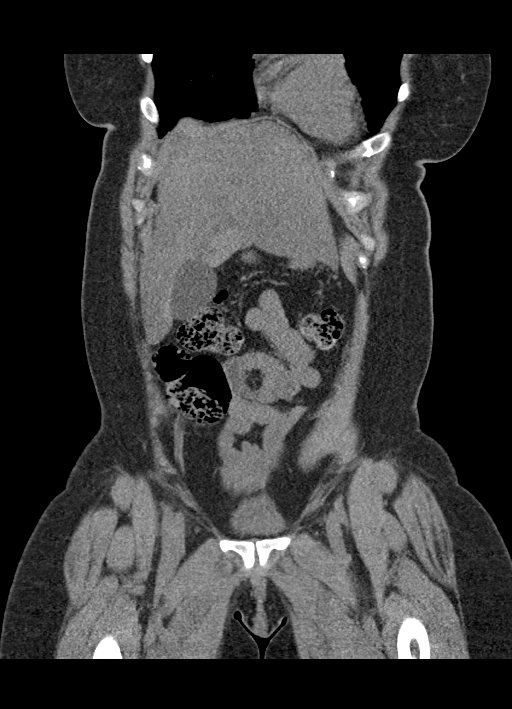
[im 67/151  soft-tissue]
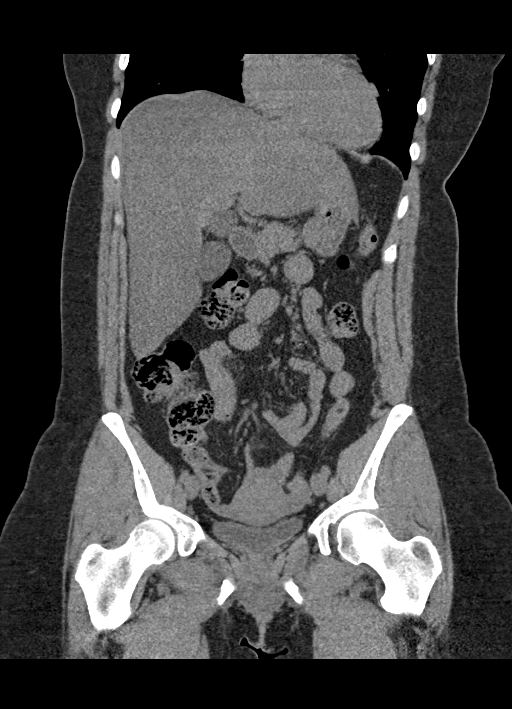
[im 84/151  soft-tissue]
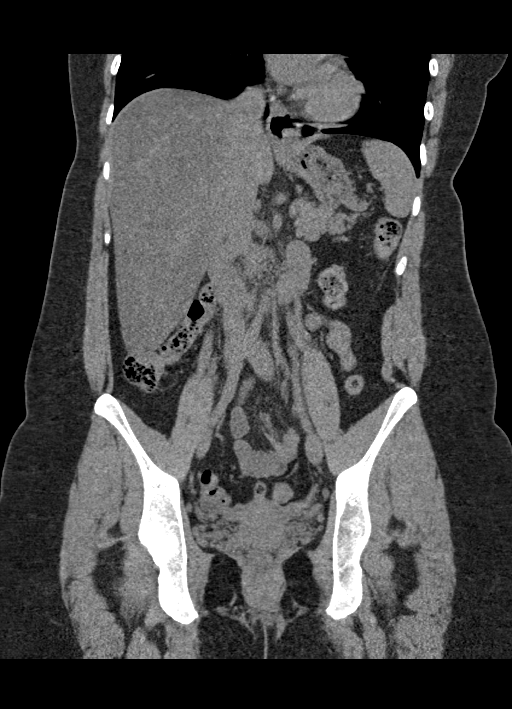

[15 of 46 positions shown; findings below may reference images not displayed]

FINDINGS: Lower chest: No acute abnormality.

Hepatobiliary: Hepatic steatosis. Focal fatty sparing adjacent to
the gallbladder fossa and in the porta hepatis. The gallbladder is
normal. No other abnormalities.

Pancreas: Unremarkable. No pancreatic ductal dilatation or
surrounding inflammatory changes.

Spleen: Normal in size without focal abnormality.

Adrenals/Urinary Tract: Adrenal glands are normal. No stones seen in
the left kidney. At least 2 small stones are seen in the right
kidney measuring approximately 2 mm. Left-sided hydronephrosis is
identified with very mild perinephric stranding. Left ureterectasis
is identified, extending to the left UVJ. There is a 3 mm stone in
the left UVJ accounting for the left ureterectasis and
hydronephrosis. No hydronephrosis on the right. The right ureter is
normal with no stones. The bladder is unremarkable other than the
left UVJ stone.

Stomach/Bowel: There is a small hiatal hernia. The stomach and small
bowel are otherwise normal. The colon is normal. The appendix is not
seen but there is no secondary evidence of appendicitis.

Vascular/Lymphatic: No significant vascular findings are present. No
enlarged abdominal or pelvic lymph nodes.

Reproductive: Uterus and bilateral adnexa are unremarkable.

Other: Fat containing periumbilical hernia.  No free air free fluid.

Musculoskeletal: No acute or significant osseous findings.
IMPRESSION: 1. There is a 3 mm stone at the left UVJ resulting in
hydronephrosis, mild perinephric stranding, and ureterectasis.
2. No other stone seen in the left kidney. Two small stones are seen
in the right kidney.
3. Small hiatal hernia.
4. The appendix is not seen but there is no secondary evidence of
appendicitis.
5. Small fat containing periumbilical hernia.
6. Hepatic steatosis.

## 2022-02-06 ENCOUNTER — Other Ambulatory Visit: Payer: Self-pay | Admitting: Obstetrics and Gynecology

## 2022-02-06 DIAGNOSIS — N6310 Unspecified lump in the right breast, unspecified quadrant: Secondary | ICD-10-CM

## 2022-03-26 ENCOUNTER — Ambulatory Visit
Admission: RE | Admit: 2022-03-26 | Discharge: 2022-03-26 | Disposition: A | Discharge status: Home / Self Care | Payer: 59 | Source: Ambulatory Visit | Attending: Obstetrics and Gynecology | Admitting: Obstetrics and Gynecology

## 2022-03-26 ENCOUNTER — Ambulatory Visit
Admission: RE | Admit: 2022-03-26 | Discharge: 2022-03-26 | Disposition: A | Discharge status: Home / Self Care | Payer: BC Managed Care – PPO | Source: Ambulatory Visit | Attending: Obstetrics and Gynecology | Admitting: Obstetrics and Gynecology

## 2022-03-26 DIAGNOSIS — N6310 Unspecified lump in the right breast, unspecified quadrant: Secondary | ICD-10-CM

## 2024-03-01 ENCOUNTER — Other Ambulatory Visit: Payer: Self-pay | Admitting: Obstetrics and Gynecology

## 2024-03-01 DIAGNOSIS — Z1231 Encounter for screening mammogram for malignant neoplasm of breast: Secondary | ICD-10-CM

## 2024-05-13 ENCOUNTER — Ambulatory Visit

## 2024-05-13 ENCOUNTER — Ambulatory Visit
Admission: RE | Admit: 2024-05-13 | Discharge: 2024-05-13 | Disposition: A | Discharge status: Home / Self Care | Source: Ambulatory Visit | Attending: Obstetrics and Gynecology | Admitting: Obstetrics and Gynecology

## 2024-05-13 DIAGNOSIS — Z1231 Encounter for screening mammogram for malignant neoplasm of breast: Secondary | ICD-10-CM
